# Patient Record
Sex: Female | Born: 1990 | Race: Black or African American | Hispanic: No | Marital: Single | Smoking: Never smoker
Health system: Southern US, Community
[De-identification: ages and names within clinical notes are randomized; demographics above are authoritative.]

## PROBLEM LIST (undated history)

## (undated) DIAGNOSIS — Z86018 Personal history of other benign neoplasm: Secondary | ICD-10-CM

## (undated) DIAGNOSIS — K602 Anal fissure, unspecified: Secondary | ICD-10-CM

## (undated) DIAGNOSIS — Z9289 Personal history of other medical treatment: Secondary | ICD-10-CM

## (undated) HISTORY — DX: Anal fissure, unspecified: K60.2

## (undated) HISTORY — PX: TOOTH EXTRACTION: SUR596

## (undated) HISTORY — DX: Personal history of other benign neoplasm: Z86.018

---

## 2012-04-02 ENCOUNTER — Ambulatory Visit: Payer: Self-pay | Admitting: Physician Assistant

## 2012-04-02 VITALS — BP 139/89 | HR 85 | Temp 98.7°F | Resp 16 | Ht 66.5 in | Wt 111.2 lb

## 2012-04-02 DIAGNOSIS — K0889 Other specified disorders of teeth and supporting structures: Secondary | ICD-10-CM

## 2012-04-02 DIAGNOSIS — K089 Disorder of teeth and supporting structures, unspecified: Secondary | ICD-10-CM

## 2012-04-02 DIAGNOSIS — K029 Dental caries, unspecified: Secondary | ICD-10-CM

## 2012-04-02 MED ORDER — HYDROCODONE-ACETAMINOPHEN 5-325 MG PO TABS: 1.0000 | ORAL_TABLET | Freq: Four times a day (QID) | ORAL | Status: DC | PRN

## 2012-04-02 MED ORDER — AMOXICILLIN 875 MG PO TABS: 1750.0000 mg | ORAL_TABLET | Freq: Two times a day (BID) | ORAL | Status: DC

## 2012-04-02 NOTE — Patient Instructions (Addendum)
Begin taking the antibiotic tonight.  Take one pill twice daily.  Take with food to reduce stomach upset.  Vicodin every 6 hours if needed for pain.  I would encourage you to call a dentist tomorrow as you likely will need that tooth repaired   Dental Caries  Tooth decay (dental caries, cavities) is the most common of all oral diseases. It occurs in all ages but is more common in children and young adults.  CAUSES  Bacteria in your mouth combine with foods (particularly sugars and starches) to produce plaque. Plaque is a substance that sticks to the hard surfaces of teeth. The bacteria in the plaque produce acids that attack the enamel of teeth. Repeated acid attacks dissolve the enamel and create holes in the teeth. Root surfaces of teeth may also get these holes.  Other contributing factors include:   Frequent snacking and drinking of cavity-producing foods and liquids.  Poor oral hygiene.  Dry mouth.  Substance abuse such as methamphetamine.  Broken or poor fitting dental restorations.  Eating disorders.  Gastroesophageal reflux disease (GERD).  Certain radiation treatments to the head and neck. SYMPTOMS  At first, dental decay appears as white, chalky areas on the enamel. In this early stage, symptoms are seldom present. As the decay progresses, pits and holes may appear on the enamel surfaces. Progression of the decay will lead to softening of the hard layers of the tooth. At this point you may experience some pain or achy feeling after sweet, hot, or cold foods or drinks are consumed. If left untreated, the decay will reach the internal structures of the tooth and produce severe pain. Extensive dental treatment, such as root canal therapy, may be needed to save the tooth at this late stage of decay development.  DIAGNOSIS  Most cavities will be detected during regular check-ups. A thorough medical and dental history will be taken by the dentist. The dentist will use instruments to  check the surfaces of your teeth for any breakdown or discoloration. Some dentists have special instruments, such as lasers, that detect tooth decay. Dental X-rays may also show some cavities that are not visible to the eye (such as between the contact areas of the teeth). TREATMENT  Treatment involves removal of the tooth decay and replacement with a restorative material such as silver, gold, or composite (white) material. However, if the decay involves a large area of the tooth and there is little remaining healthy tooth structure, a cap (crown) will be fitted over the remaining structure. If the decay involves the center part of the tooth (pulp), root canal treatment will be needed before any type of dental restoration is placed. If the tooth is severely destroyed by the decay process, leaving the remaining tooth structures unrestorable, the tooth will need to be pulled (extracted). Some early tooth decay may be reversed by fluoride treatments and thorough brushing and flossing at home. PREVENTION   Eat healthy foods. Restrict the amount of sugary, starchy foods and liquids you consume. Avoid frequent snacking and drinking of unhealthy foods and liquids.  Sealants can help with prevention of cavities. Sealants are composite resins applied onto the biting surfaces of teeth at risk for decay. They smooth out the pits and grooves and prevent food from being trapped in them. This is done in early childhood before tooth decay has started.  Fluoride tablets may also be prescribed to children between 6 months and 1 years of age if your drinking water is not fluoridated. The fluoride  absorbed by the tooth enamel makes teeth less susceptible to decay. Thorough daily cleaning with a toothbrush and dental floss is the best way to prevent cavities. Use of a fluoride toothpaste is highly recommended. Fluoride mouth rinses may be used in specific cases.  Topical application of fluoride by your dentist is important  in children.  Regular visits with a dentist for checkups and cleanings are also important. SEEK IMMEDIATE DENTAL CARE IF:  You have a fever.  You develop redness and swelling of your face, jaw, or neck.  You develop swelling around a tooth.  You are unable to open your mouth or cannot swallow.  You have severe pain uncontrolled by pain medicine. Document Released: 11/02/2001 Document Revised: 05/05/2011 Document Reviewed: 07/18/2010 [facility] Patient Information 2013 [place], [place].

## 2012-04-02 NOTE — Progress Notes (Signed)
  Subjective:    Patient ID: Wendy Mccarty, female    DOB: December 29, 1990, 22 y.o.   MRN: [id number]  HPI   [name] is a pleasant 22 yr old female with an "excruciating tooth ache".  Bottom right molar has been hurting off and on for about a month.  This morning the pain became worse.  States she can see a hole in the tooth, knows she needs to go to a dentist.  She is new to the area and does not have a dentist yet.  Also was waiting until she got dental insurance.  Has used Advil and aspirin today for pain which is not helping.  Requests something stronger for pain.  Tooth pain has caused her to have a headache today.  Denies any other associated symptoms.     Review of Systems  Constitutional: Negative for fever and chills.  HENT: Positive for dental problem. Negative for ear pain, nosebleeds, congestion, sore throat, rhinorrhea, sneezing, drooling, mouth sores and postnasal drip.   Respiratory: Negative.   Cardiovascular: Negative.   Gastrointestinal: Negative.   Musculoskeletal: Negative.   Skin: Negative.   Neurological: Positive for headaches.       Objective:   Physical Exam  Vitals reviewed. Constitutional: She is oriented to person, place, and time. She appears well-developed and well-nourished. No distress.  HENT:  Head: Normocephalic and atraumatic.  Mouth/Throat: Abnormal dentition. Dental caries present. No dental abscesses.         Multiple dental caries; prominent decay of right lower molar   Eyes: Conjunctivae normal are normal. No scleral icterus.  Neck: Neck supple.  Cardiovascular: Normal rate, regular rhythm and normal heart sounds.  Exam reveals no gallop and no friction rub.   No murmur heard. Pulmonary/Chest: Effort normal and breath sounds normal. She has no wheezes. She has no rales.  Lymphadenopathy:    She has no cervical adenopathy.  Neurological: She is alert and oriented to person, place, and time.  Skin: Skin is warm and dry.  Psychiatric: She has a  normal mood and affect.     Filed Vitals:   04/02/12 1831  BP: 139/89  Pulse: 85  Temp: 98.7 F (37.1 C)  Resp: 16        Assessment & Plan:   1. Tooth pain  amoxicillin (AMOXIL) 875 MG tablet, HYDROcodone-acetaminophen (NORCO/VICODIN) 5-325 MG per tablet  2. Dental caries  amoxicillin (AMOXIL) 875 MG tablet    [name] is a pleasant 22 yr old female here with tooth pain and tooth decay.  The tooth does not appear to be abscessed at this time.  Will start antibiotics to cover for infection.  Vicodin for pain relief.  Encouraged her to call a dentist tomorrow.  Provided her with the phone number of a local dentist that is open over the weekend and will take emergencies.

## 2012-04-12 ENCOUNTER — Emergency Department (HOSPITAL_COMMUNITY): Payer: Self-pay

## 2012-04-12 ENCOUNTER — Encounter (HOSPITAL_COMMUNITY): Payer: Self-pay | Admitting: Emergency Medicine

## 2012-04-12 ENCOUNTER — Emergency Department (HOSPITAL_COMMUNITY)
Admission: EM | Admit: 2012-04-12 | Discharge: 2012-04-12 | Disposition: A | Discharge status: Home / Self Care | Payer: Self-pay | Attending: Emergency Medicine | Admitting: Emergency Medicine

## 2012-04-12 DIAGNOSIS — Y9289 Other specified places as the place of occurrence of the external cause: Secondary | ICD-10-CM | POA: Insufficient documentation

## 2012-04-12 DIAGNOSIS — R296 Repeated falls: Secondary | ICD-10-CM | POA: Insufficient documentation

## 2012-04-12 DIAGNOSIS — Z79899 Other long term (current) drug therapy: Secondary | ICD-10-CM | POA: Insufficient documentation

## 2012-04-12 DIAGNOSIS — S52613A Displaced fracture of unspecified ulna styloid process, initial encounter for closed fracture: Secondary | ICD-10-CM

## 2012-04-12 DIAGNOSIS — S52609A Unspecified fracture of lower end of unspecified ulna, initial encounter for closed fracture: Secondary | ICD-10-CM | POA: Insufficient documentation

## 2012-04-12 DIAGNOSIS — Y939 Activity, unspecified: Secondary | ICD-10-CM | POA: Insufficient documentation

## 2012-04-12 DIAGNOSIS — S52509A Unspecified fracture of the lower end of unspecified radius, initial encounter for closed fracture: Secondary | ICD-10-CM

## 2012-04-12 DIAGNOSIS — S52599A Other fractures of lower end of unspecified radius, initial encounter for closed fracture: Secondary | ICD-10-CM | POA: Insufficient documentation

## 2012-04-12 MED ORDER — OXYCODONE-ACETAMINOPHEN 5-325 MG PO TABS
2.0000 | ORAL_TABLET | Freq: Once | ORAL | Status: AC
  Administered 2012-04-12: 2 via ORAL
  Filled 2012-04-12: qty 2

## 2012-04-12 MED ORDER — FENTANYL CITRATE 0.05 MG/ML IJ SOLN
50.0000 ug | Freq: Once | INTRAMUSCULAR | Status: AC
  Administered 2012-04-12: 50 ug via INTRAVENOUS
  Filled 2012-04-12: qty 2

## 2012-04-12 MED ORDER — DIPHENHYDRAMINE HCL 50 MG/ML IJ SOLN
INTRAMUSCULAR | Status: AC
  Filled 2012-04-12: qty 1

## 2012-04-12 MED ORDER — OXYCODONE-ACETAMINOPHEN 5-325 MG PO TABS: 1.0000 | ORAL_TABLET | Freq: Four times a day (QID) | ORAL | Status: DC | PRN

## 2012-04-12 MED ORDER — FENTANYL CITRATE 0.05 MG/ML IJ SOLN
50.0000 ug | Freq: Once | INTRAMUSCULAR | Status: AC
  Administered 2012-04-12: 50 ug via INTRAVENOUS

## 2012-04-12 MED ORDER — KETOROLAC TROMETHAMINE 30 MG/ML IJ SOLN
30.0000 mg | Freq: Once | INTRAMUSCULAR | Status: AC
  Administered 2012-04-12: 30 mg via INTRAVENOUS
  Filled 2012-04-12: qty 1

## 2012-04-12 MED ORDER — ONDANSETRON HCL 4 MG/2ML IJ SOLN
4.0000 mg | Freq: Once | INTRAMUSCULAR | Status: AC
  Administered 2012-04-12: 4 mg via INTRAVENOUS
  Filled 2012-04-12: qty 2

## 2012-04-12 NOTE — ED Provider Notes (Signed)
History     CSN: [id number]  Arrival date & time 04/12/12  0400   First MD Initiated Contact with Patient 04/12/12 0402      Chief Complaint  Patient presents with  . Wrist Pain   HPI  History provided by the patient. Patient is a 22 year old female with no significant PMH who presents with left wrist pain and injury after fall. Patient states she was returning home from clubbing morning of steps when she lost balance and fell backwards. She fell onto her outstretched left and right hands but reports having pain, swelling and deformity to her left wrist. Pain is described as severe. Patient has not used any treatments for her symptoms and came to emergency room. She denies any numbness or weakness in the hand. She reports increasing pain with any movements of the wrist or hand. Denies any other injury. Denies head injury or LOC. She denies any alcohol use.    History reviewed. No pertinent past medical history.  History reviewed. No pertinent past surgical history.  Family History  Problem Relation Age of Onset  . Hypertension Mother   . Hypertension Maternal Grandfather     History  Substance Use Topics  . Smoking status: Never Smoker   . Smokeless tobacco: Not on file  . Alcohol Use: No    OB History   Grav Para Term Preterm Abortions TAB SAB Ect Mult Living                  Review of Systems  All other systems reviewed and are negative.    Allergies  Review of patient's allergies indicates no known allergies.  Home Medications   Current Outpatient Rx  Name  Route  Sig  Dispense  Refill  . amoxicillin (AMOXIL) 875 MG tablet   Oral   Take 2 tablets (1,750 mg total) by mouth 2 (two) times daily.   20 tablet   0   . aspirin 325 MG tablet   Oral   Take 325 mg by mouth every 6 (six) hours as needed for pain.         Marland Kitchen HYDROcodone-acetaminophen (NORCO/VICODIN) 5-325 MG per tablet   Oral   Take 1 tablet by mouth every 6 (six) hours as needed for pain.  20 tablet   0   . naproxen sodium (ANAPROX) 220 MG tablet   Oral   Take 220 mg by mouth 2 (two) times daily as needed (for pain).          . Norethindrone Acetate-Ethinyl Estradiol (LOESTRIN 1.5/30, 21,) 1.5-30 MG-MCG tablet   Oral   Take 1 tablet by mouth daily.           BP 144/84  Pulse 103  Temp(Src) 98.2 F (36.8 C) (Oral)  Resp 20  SpO2 98%  LMP 03/28/2012  Physical Exam  Nursing note and vitals reviewed. Constitutional: She is oriented to person, place, and time. She appears well-developed and well-nourished. No distress.  HENT:  Head: Normocephalic and atraumatic.  Eyes: Conjunctivae are normal.  Neck: Normal range of motion. Neck supple.  Cardiovascular: Normal rate and regular rhythm.   Pulmonary/Chest: Effort normal and breath sounds normal.  Musculoskeletal: She exhibits edema and tenderness.  Reduced range of motion of left wrist and hand secondary to pain and swelling. There is swelling and deformity to the left wrist. Normal radial pulses. Normal sensation and cap refill to the tips of all fingers.  Neurological: She is alert and oriented to person, place,  and time.  Skin: Skin is warm and dry. No rash noted.  Psychiatric: She has a normal mood and affect. Her behavior is normal.    ED Course  Procedures   Dg Wrist Complete Left  04/12/2012  *RADIOLOGY REPORT*  Clinical Data: Left wrist pain following trauma  LEFT WRIST - COMPLETE 3+ VIEW  Comparison: None.  Findings: Transverse fracture distal radius with mild comminution. Dorsal displacement and angulation.  There may be a nondominant vertically oriented component extending to the radiocarpal joint however no joint space step off.  Ulnar styloid fracture.  Diffuse soft tissue swelling.  IMPRESSION: Distal radius and ulnar styloid fractures as above.   Original Report Authenticated By: Jearld Lesch, M.D.      1. Distal radial fracture   2. Fracture of ulnar styloid       MDM  Patient seen  and evaluated. Patient appears uncomfortable holding left wrist across flap on the ice pack. Patient given initial triage pain protocol medications with fentanyl. Additional pain medications ordered.  Patient and X-rays reviewed and discussed with attending physician. Will place patient in sugar tong splint and sling and provide orthopedic hand referral.      Angus Seller, PA 04/12/12 [phone]

## 2012-04-12 NOTE — Progress Notes (Signed)
Orthopedic Tech Progress Note Patient Details:  Wendy Mccarty 1990-12-09 [id number]  Ortho Devices Type of Ortho Device: Arm sling;Sugartong splint Ortho Device/Splint Location: left  arm   Haskell Flirt 04/12/2012, 5:20 AM

## 2012-04-12 NOTE — ED Notes (Signed)
Pt to xray

## 2012-04-12 NOTE — ED Notes (Signed)
EDPA finished at [facility], ortho tech paged, pending arrival, PO meds given with crackers, pain worse after xray, no relief from fentanyl. Denies nausea.

## 2012-04-12 NOTE — ED Notes (Signed)
Pt slipped and fell on steps just pta.  C/o pain, swelling, and deformity to L wrist.  CMS intact.  Pt straight to treatment room on arrival and ice pack given.

## 2012-04-12 NOTE — ED Notes (Signed)
"  feels better", rates 2/10, cap refill <2sec, fingers warm.

## 2012-04-13 NOTE — ED Provider Notes (Signed)
Medical screening examination/treatment/procedure(s) were performed by non-physician practitioner and as supervising physician I was immediately available for consultation/collaboration.  Geoffery Lyons, MD 04/13/12 [phone]

## 2012-04-14 ENCOUNTER — Encounter (HOSPITAL_COMMUNITY): Payer: Self-pay | Admitting: Pharmacy Technician

## 2012-04-14 ENCOUNTER — Encounter (HOSPITAL_COMMUNITY): Payer: Self-pay | Admitting: *Deleted

## 2012-04-15 ENCOUNTER — Ambulatory Visit (HOSPITAL_COMMUNITY): Payer: Self-pay

## 2012-04-15 ENCOUNTER — Encounter (HOSPITAL_COMMUNITY): Payer: Self-pay | Admitting: Anesthesiology

## 2012-04-15 ENCOUNTER — Observation Stay (HOSPITAL_COMMUNITY)
Admission: RE | Admit: 2012-04-15 | Discharge: 2012-04-16 | Disposition: A | Discharge status: Home / Self Care | Payer: Self-pay | Source: Ambulatory Visit | Attending: Orthopedic Surgery | Admitting: Orthopedic Surgery

## 2012-04-15 ENCOUNTER — Ambulatory Visit (HOSPITAL_COMMUNITY): Payer: Self-pay | Admitting: Anesthesiology

## 2012-04-15 ENCOUNTER — Other Ambulatory Visit: Payer: Self-pay | Admitting: Orthopedic Surgery

## 2012-04-15 ENCOUNTER — Encounter (HOSPITAL_COMMUNITY): Payer: Self-pay | Admitting: *Deleted

## 2012-04-15 ENCOUNTER — Encounter (HOSPITAL_COMMUNITY): Payer: Self-pay | Admitting: Pharmacy Technician

## 2012-04-15 ENCOUNTER — Encounter (HOSPITAL_COMMUNITY)
Admission: RE | Disposition: A | Discharge status: Home / Self Care | Payer: Self-pay | Source: Ambulatory Visit | Attending: Orthopedic Surgery

## 2012-04-15 DIAGNOSIS — G5600 Carpal tunnel syndrome, unspecified upper limb: Secondary | ICD-10-CM | POA: Insufficient documentation

## 2012-04-15 DIAGNOSIS — S52509A Unspecified fracture of the lower end of unspecified radius, initial encounter for closed fracture: Principal | ICD-10-CM | POA: Insufficient documentation

## 2012-04-15 DIAGNOSIS — W108XXA Fall (on) (from) other stairs and steps, initial encounter: Secondary | ICD-10-CM | POA: Insufficient documentation

## 2012-04-15 DIAGNOSIS — S52532A Colles' fracture of left radius, initial encounter for closed fracture: Secondary | ICD-10-CM

## 2012-04-15 HISTORY — DX: Personal history of other medical treatment: Z92.89

## 2012-04-15 HISTORY — PX: CARPAL TUNNEL RELEASE: SHX101

## 2012-04-15 HISTORY — PX: OPEN REDUCTION INTERNAL FIXATION (ORIF) DISTAL RADIAL FRACTURE: SHX5989

## 2012-04-15 LAB — SURGICAL PCR SCREEN
MRSA, PCR: NEGATIVE
Staphylococcus aureus: POSITIVE — AB

## 2012-04-15 LAB — CBC
MCH: 32.3 pg (ref 26.0–34.0)
Platelets: 293 10*3/uL (ref 150–400)
RBC: 4.64 MIL/uL (ref 3.87–5.11)
WBC: 10.2 10*3/uL (ref 4.0–10.5)

## 2012-04-15 SURGERY — OPEN REDUCTION INTERNAL FIXATION (ORIF) DISTAL RADIUS FRACTURE
Anesthesia: General | Site: Wrist | Laterality: Left | Wound class: Clean

## 2012-04-15 MED ORDER — LACTATED RINGERS IV SOLN: INTRAVENOUS | Status: DC

## 2012-04-15 MED ORDER — CEFAZOLIN SODIUM 1-5 GM-% IV SOLN
1.0000 g | Freq: Three times a day (TID) | INTRAVENOUS | Status: DC
  Administered 2012-04-16 (×2): 1 g via INTRAVENOUS
  Filled 2012-04-15 (×4): qty 50

## 2012-04-15 MED ORDER — MEPERIDINE HCL 25 MG/ML IJ SOLN: 6.2500 mg | INTRAMUSCULAR | Status: DC | PRN

## 2012-04-15 MED ORDER — BUPIVACAINE-EPINEPHRINE PF 0.5-1:200000 % IJ SOLN
INTRAMUSCULAR | Status: DC | PRN
  Administered 2012-04-15: 30 mL

## 2012-04-15 MED ORDER — OXYCODONE HCL 5 MG PO TABS
5.0000 mg | ORAL_TABLET | ORAL | Status: DC | PRN
  Administered 2012-04-16: 5 mg via ORAL
  Administered 2012-04-16: 10 mg via ORAL
  Administered 2012-04-16: 5 mg via ORAL
  Filled 2012-04-15 (×2): qty 1
  Filled 2012-04-15: qty 2

## 2012-04-15 MED ORDER — CHLORHEXIDINE GLUCONATE 4 % EX LIQD
60.0000 mL | Freq: Once | CUTANEOUS | Status: AC
  Administered 2012-04-15: 4 via TOPICAL

## 2012-04-15 MED ORDER — PROMETHAZINE HCL 25 MG RE SUPP: 12.5000 mg | Freq: Four times a day (QID) | RECTAL | Status: DC | PRN

## 2012-04-15 MED ORDER — DEXAMETHASONE SODIUM PHOSPHATE 4 MG/ML IJ SOLN
INTRAMUSCULAR | Status: DC | PRN
  Administered 2012-04-15: 4 mg via INTRAVENOUS

## 2012-04-15 MED ORDER — CEFAZOLIN SODIUM-DEXTROSE 2-3 GM-% IV SOLR
2.0000 g | INTRAVENOUS | Status: AC
  Administered 2012-04-15: 2 g via INTRAVENOUS
  Filled 2012-04-15: qty 50

## 2012-04-15 MED ORDER — PROPOFOL 10 MG/ML IV BOLUS
INTRAVENOUS | Status: DC | PRN
  Administered 2012-04-15: 50 mg via INTRAVENOUS
  Administered 2012-04-15: 150 mg via INTRAVENOUS

## 2012-04-15 MED ORDER — ONDANSETRON HCL 4 MG/2ML IJ SOLN
INTRAMUSCULAR | Status: DC | PRN
  Administered 2012-04-15: 4 mg via INTRAVENOUS

## 2012-04-15 MED ORDER — ONDANSETRON HCL 4 MG/2ML IJ SOLN: 4.0000 mg | Freq: Four times a day (QID) | INTRAMUSCULAR | Status: DC | PRN

## 2012-04-15 MED ORDER — FAMOTIDINE 20 MG PO TABS
20.0000 mg | ORAL_TABLET | Freq: Two times a day (BID) | ORAL | Status: DC | PRN
  Filled 2012-04-15: qty 1

## 2012-04-15 MED ORDER — MUPIROCIN 2 % EX OINT
TOPICAL_OINTMENT | CUTANEOUS | Status: AC
  Administered 2012-04-15: 1 via NASAL
  Filled 2012-04-15: qty 22

## 2012-04-15 MED ORDER — OXYCODONE HCL 5 MG/5ML PO SOLN: 5.0000 mg | Freq: Once | ORAL | Status: DC | PRN

## 2012-04-15 MED ORDER — ALPRAZOLAM 0.5 MG PO TABS
0.5000 mg | ORAL_TABLET | Freq: Four times a day (QID) | ORAL | Status: DC | PRN
  Administered 2012-04-16: 0.5 mg via ORAL
  Filled 2012-04-15: qty 1

## 2012-04-15 MED ORDER — ONDANSETRON HCL 4 MG/2ML IJ SOLN: 4.0000 mg | Freq: Once | INTRAMUSCULAR | Status: DC | PRN

## 2012-04-15 MED ORDER — FENTANYL CITRATE 0.05 MG/ML IJ SOLN
INTRAMUSCULAR | Status: DC | PRN
  Administered 2012-04-15: 100 ug via INTRAVENOUS

## 2012-04-15 MED ORDER — MIDAZOLAM HCL 5 MG/5ML IJ SOLN: INTRAMUSCULAR | Status: DC | PRN

## 2012-04-15 MED ORDER — MIDAZOLAM HCL 5 MG/5ML IJ SOLN
INTRAMUSCULAR | Status: DC | PRN
  Administered 2012-04-15: 2 mg via INTRAVENOUS

## 2012-04-15 MED ORDER — LIDOCAINE HCL (CARDIAC) 20 MG/ML IV SOLN
INTRAVENOUS | Status: DC | PRN
  Administered 2012-04-15: 80 mg via INTRAVENOUS

## 2012-04-15 MED ORDER — METHOCARBAMOL 100 MG/ML IJ SOLN: 500.0000 mg | Freq: Four times a day (QID) | INTRAVENOUS | Status: DC | PRN

## 2012-04-15 MED ORDER — FENTANYL CITRATE 0.05 MG/ML IJ SOLN: INTRAMUSCULAR | Status: DC | PRN

## 2012-04-15 MED ORDER — HYDROMORPHONE HCL PF 1 MG/ML IJ SOLN: 0.2500 mg | INTRAMUSCULAR | Status: DC | PRN

## 2012-04-15 MED ORDER — CEFAZOLIN SODIUM 1-5 GM-% IV SOLN
1.0000 g | INTRAVENOUS | Status: AC
  Administered 2012-04-16: 1 g via INTRAVENOUS
  Filled 2012-04-15: qty 50

## 2012-04-15 MED ORDER — OXYCODONE HCL 5 MG PO TABS: 5.0000 mg | ORAL_TABLET | Freq: Once | ORAL | Status: DC | PRN

## 2012-04-15 MED ORDER — MORPHINE SULFATE 2 MG/ML IJ SOLN
1.0000 mg | INTRAMUSCULAR | Status: DC | PRN
  Administered 2012-04-16 (×2): 1 mg via INTRAVENOUS
  Filled 2012-04-15 (×2): qty 1

## 2012-04-15 MED ORDER — ONDANSETRON HCL 4 MG PO TABS: 4.0000 mg | ORAL_TABLET | Freq: Four times a day (QID) | ORAL | Status: DC | PRN

## 2012-04-15 MED ORDER — 0.9 % SODIUM CHLORIDE (POUR BTL) OPTIME
TOPICAL | Status: DC | PRN
  Administered 2012-04-15 (×2): 1000 mL

## 2012-04-15 MED ORDER — LACTATED RINGERS IV SOLN
INTRAVENOUS | Status: DC | PRN
  Administered 2012-04-15: 19:00:00 via INTRAVENOUS

## 2012-04-15 MED ORDER — METHOCARBAMOL 500 MG PO TABS
500.0000 mg | ORAL_TABLET | Freq: Four times a day (QID) | ORAL | Status: DC | PRN
  Administered 2012-04-16: 500 mg via ORAL
  Filled 2012-04-15 (×2): qty 1

## 2012-04-15 MED ORDER — SODIUM CHLORIDE 0.45 % IV SOLN: INTRAVENOUS | Status: DC

## 2012-04-15 MED ORDER — DOCUSATE SODIUM 100 MG PO CAPS
100.0000 mg | ORAL_CAPSULE | Freq: Two times a day (BID) | ORAL | Status: DC
  Administered 2012-04-16: 100 mg via ORAL
  Filled 2012-04-15 (×3): qty 1

## 2012-04-15 MED ORDER — BUPIVACAINE HCL (PF) 0.25 % IJ SOLN
INTRAMUSCULAR | Status: AC
  Filled 2012-04-15: qty 30

## 2012-04-15 MED ORDER — VITAMIN C 500 MG PO TABS
1000.0000 mg | ORAL_TABLET | Freq: Every day | ORAL | Status: DC
  Administered 2012-04-16: 1000 mg via ORAL
  Filled 2012-04-15: qty 2

## 2012-04-15 SURGICAL SUPPLY — 66 items
BANDAGE ELASTIC 3 VELCRO ST LF (GAUZE/BANDAGES/DRESSINGS) ×6 IMPLANT
BANDAGE ELASTIC 4 VELCRO ST LF (GAUZE/BANDAGES/DRESSINGS) ×3 IMPLANT
BANDAGE GAUZE ELAST BULKY 4 IN (GAUZE/BANDAGES/DRESSINGS) ×3 IMPLANT
BIT DRILL 2 FAST STEP (BIT) ×3 IMPLANT
BLADE SURG ROTATE 9660 (MISCELLANEOUS) IMPLANT
BNDG ESMARK 4X9 LF (GAUZE/BANDAGES/DRESSINGS) ×3 IMPLANT
CLOTH BEACON ORANGE TIMEOUT ST (SAFETY) ×3 IMPLANT
CORDS BIPOLAR (ELECTRODE) ×3 IMPLANT
COVER SURGICAL LIGHT HANDLE (MISCELLANEOUS) ×3 IMPLANT
CUFF TOURNIQUET SINGLE 18IN (TOURNIQUET CUFF) ×3 IMPLANT
CUFF TOURNIQUET SINGLE 24IN (TOURNIQUET CUFF) IMPLANT
DECANTER SPIKE VIAL GLASS SM (MISCELLANEOUS) IMPLANT
DRAIN TLS ROUND 10FR (DRAIN) IMPLANT
DRAPE OEC MINIVIEW 54X84 (DRAPES) IMPLANT
DRAPE SURG 17X23 STRL (DRAPES) ×3 IMPLANT
DRAPE U-SHAPE 47X51 STRL (DRAPES) ×3 IMPLANT
DRSG EMULSION OIL 3X3 NADH (GAUZE/BANDAGES/DRESSINGS) ×3 IMPLANT
EVACUATOR 1/8 PVC DRAIN (DRAIN) IMPLANT
GAUZE XEROFORM 1X8 LF (GAUZE/BANDAGES/DRESSINGS) ×3 IMPLANT
GLOVE BIOGEL M STRL SZ7.5 (GLOVE) ×3 IMPLANT
GLOVE ORTHO TXT STRL SZ7.5 (GLOVE) ×3 IMPLANT
GLOVE SS BIOGEL STRL SZ 8 (GLOVE) ×2 IMPLANT
GLOVE SUPERSENSE BIOGEL SZ 8 (GLOVE) ×1
GOWN PREVENTION PLUS XLARGE (GOWN DISPOSABLE) ×3 IMPLANT
GOWN STRL NON-REIN LRG LVL3 (GOWN DISPOSABLE) ×9 IMPLANT
GOWN STRL REIN XL XLG (GOWN DISPOSABLE) ×6 IMPLANT
KIT BASIN OR (CUSTOM PROCEDURE TRAY) ×3 IMPLANT
KIT ROOM TURNOVER OR (KITS) ×3 IMPLANT
LOOP VESSEL MAXI BLUE (MISCELLANEOUS) IMPLANT
MANIFOLD NEPTUNE II (INSTRUMENTS) ×3 IMPLANT
NEEDLE 22X1 1/2 (OR ONLY) (NEEDLE) IMPLANT
NEEDLE HYPO 25GX1X1/2 BEV (NEEDLE) IMPLANT
NS IRRIG 1000ML POUR BTL (IV SOLUTION) ×3 IMPLANT
PACK ORTHO EXTREMITY (CUSTOM PROCEDURE TRAY) ×3 IMPLANT
PAD ARMBOARD 7.5X6 YLW CONV (MISCELLANEOUS) ×6 IMPLANT
PAD CAST 4YDX4 CTTN HI CHSV (CAST SUPPLIES) ×4 IMPLANT
PADDING CAST COTTON 4X4 STRL (CAST SUPPLIES) ×2
PEG SUBCHONDRAL SMOOTH 2.0X16 (Peg) ×3 IMPLANT
PEG SUBCHONDRAL SMOOTH 2.0X18 (Peg) ×6 IMPLANT
PEG SUBCHONDRAL SMOOTH 2.0X20 (Peg) ×6 IMPLANT
PEG SUBCHONDRAL SMOOTH 2.0X22 (Peg) ×3 IMPLANT
PLATE SHORT 57 NRRW LT (Plate) ×3 IMPLANT
SCREW BN 12X3.5XNS CORT TI (Screw) ×4 IMPLANT
SCREW BN 13X3.5XNS CORT TI (Screw) ×2 IMPLANT
SCREW CORT 3.5X10 LNG (Screw) ×3 IMPLANT
SCREW CORT 3.5X12 (Screw) ×2 IMPLANT
SCREW CORT 3.5X13 (Screw) ×1 IMPLANT
SOLUTION BETADINE 4OZ (MISCELLANEOUS) ×3 IMPLANT
SPONGE GAUZE 4X4 12PLY (GAUZE/BANDAGES/DRESSINGS) ×3 IMPLANT
SPONGE LAP 4X18 X RAY DECT (DISPOSABLE) IMPLANT
SPONGE SCRUB IODOPHOR (GAUZE/BANDAGES/DRESSINGS) ×3 IMPLANT
SUCTION FRAZIER TIP 10 FR DISP (SUCTIONS) IMPLANT
SUT MNCRL AB 4-0 PS2 18 (SUTURE) ×3 IMPLANT
SUT PROLENE 3 0 PS 2 (SUTURE) IMPLANT
SUT PROLENE 4 0 PS 2 18 (SUTURE) ×3 IMPLANT
SUT VIC AB 2-0 CT1 27 (SUTURE)
SUT VIC AB 2-0 CT1 TAPERPNT 27 (SUTURE) IMPLANT
SUT VIC AB 3-0 FS2 27 (SUTURE) IMPLANT
SYR CONTROL 10ML LL (SYRINGE) IMPLANT
SYSTEM CHEST DRAIN TLS 7FR (DRAIN) IMPLANT
TOWEL OR 17X24 6PK STRL BLUE (TOWEL DISPOSABLE) ×3 IMPLANT
TOWEL OR 17X26 10 PK STRL BLUE (TOWEL DISPOSABLE) ×3 IMPLANT
TUBE CONNECTING 12X1/4 (SUCTIONS) ×3 IMPLANT
TUBE EVACUATION TLS (MISCELLANEOUS) ×3 IMPLANT
UNDERPAD 30X30 INCONTINENT (UNDERPADS AND DIAPERS) ×3 IMPLANT
WATER STERILE IRR 1000ML POUR (IV SOLUTION) ×3 IMPLANT

## 2012-04-15 NOTE — Anesthesia Postprocedure Evaluation (Signed)
Anesthesia Post Note  Patient: Wendy Mccarty  Procedure(s) Performed: Procedure(s) (LRB): OPEN REDUCTION INTERNAL FIXATION (ORIF) LEFT DISTAL RADIUS FRACTURE (Left) OPEN CARPAL TUNNEL RELEASE WITH REPAIR RECONSTRUCTION AS NECESSARY  (Left)  Anesthesia type: general  Patient location: PACU  Post pain: Pain level controlled  Post assessment: Patient's Cardiovascular Status Stable  Last Vitals:  Filed Vitals:   04/15/12 2045  BP: 132/70  Pulse:   Temp: 36.9 C  Resp: 18    Post vital signs: Reviewed and stable  Level of consciousness: sedated  Complications: No apparent anesthesia complications

## 2012-04-15 NOTE — Anesthesia Procedure Notes (Signed)
Anesthesia Regional Block:  Supraclavicular block  Pre-Anesthetic Checklist: ,, timeout performed, Correct Patient, Correct Site, Correct Laterality, Correct Procedure, Correct Position, site marked, Risks and benefits discussed,  Surgical consent,  Pre-op evaluation,  At surgeon's request and post-op pain management  Laterality: Left  Prep: chloraprep       Needles:   Needle Type: Echogenic Stimulator Needle     Needle Length: 5cm 5 cm Needle Gauge: 21 G    Additional Needles:  Procedures: ultrasound guided (picture in chart) and nerve stimulator Supraclavicular block  Nerve Stimulator or Paresthesia:  Response: 0.4 mA,   Additional Responses:   Narrative:  Start time: 04/15/2012 6:30 PM End time: 04/15/2012 6:45 PM Injection made incrementally with aspirations every 5 mL.  Performed by: Personally  Anesthesiologist: Arta Bruce MD  Additional Notes: Monitors applied. Patient sedated. Sterile prep and drape,hand hygiene and sterile gloves were used. Relevant anatomy identified.Needle position confirmed.Local anesthetic injected incrementally after negative aspiration. Local anesthetic spread visualized around nerve(s). Vascular puncture avoided. No complications. Image printed for medical record.The patient tolerated the procedure well.       Supraclavicular block

## 2012-04-15 NOTE — Transfer of Care (Signed)
Immediate Anesthesia Transfer of Care Note  Patient: Wendy Mccarty  Procedure(s) Performed: Procedure(s): OPEN REDUCTION INTERNAL FIXATION (ORIF) LEFT DISTAL RADIUS FRACTURE (Left) OPEN CARPAL TUNNEL RELEASE WITH REPAIR RECONSTRUCTION AS NECESSARY  (Left)  Patient Location: PACU  Anesthesia Type:General, Regional and GA combined with regional for post-op pain  Level of Consciousness: oriented, sedated, patient cooperative and responds to stimulation  Airway & Oxygen Therapy: Patient Spontanous Breathing and Patient connected to nasal cannula oxygen  Post-op Assessment: Report given to PACU RN, Post -op Vital signs reviewed and stable and Patient moving all extremities X 4  Post vital signs: Reviewed and stable  Complications: No apparent anesthesia complications

## 2012-04-15 NOTE — Op Note (Signed)
See dictation #[id number] Dominica Severin MD

## 2012-04-15 NOTE — Anesthesia Preprocedure Evaluation (Addendum)
Anesthesia Evaluation  Patient identified by MRN, date of birth, ID band Patient awake    Reviewed: Allergy & Precautions, NPO status , Patient's Chart, lab work & pertinent test results  Airway Mallampati: I  TM Distance: >3 FB Neck ROM: Full    Dental   Pulmonary          Cardiovascular     Neuro/Psych    GI/Hepatic   Endo/Other    Renal/GU      Musculoskeletal   Abdominal   Peds  Hematology   Anesthesia Other Findings   Reproductive/Obstetrics                             Anesthesia Physical Anesthesia Plan  ASA: II  Anesthesia Plan: General   Post-op Pain Management:    Induction: Intravenous  Airway Management Planned: LMA  Additional Equipment:   Intra-op Plan:   Post-operative Plan: Extubation in OR  Informed Consent: I have reviewed the patients History and Physical, chart, labs and discussed the procedure including the risks, benefits and alternatives for the proposed anesthesia with the patient or authorized representative who has indicated his/her understanding and acceptance.     Plan Discussed with: CRNA and Surgeon  Anesthesia Plan Comments:         Anesthesia Quick Evaluation  

## 2012-04-15 NOTE — H&P (Signed)
Barbera Perritt is an 22 y.o. female.   Chief Complaint:Left wrist fx HPI: Marland KitchenMarland KitchenPatient presents for evaluation and treatment of the of their upper extremity predicament. The patient denies neck back chest or of abdominal pain. The patient notes that they have no lower extremity problems. The patient from primarily complains of the upper extremity pain noted.  Past Medical History  Diagnosis Date  . History of blood transfusion     Past Surgical History  Procedure Laterality Date  . Tooth extraction      Family History  Problem Relation Age of Onset  . Hypertension Mother   . Hypertension Maternal Grandfather    Social History:  reports that she has never smoked. She does not have any smokeless tobacco history on file. She reports that she does not drink alcohol or use illicit drugs.  Allergies: No Known Allergies  Medications Prior to Admission  Medication Sig Dispense Refill  . HYDROcodone-acetaminophen (NORCO/VICODIN) 5-325 MG per tablet Take 2 tablets by mouth every 6 (six) hours as needed for pain.      Marland Kitchen levonorgestrel-ethinyl estradiol (SRONYX) 0.1-20 MG-MCG tablet Take 1 tablet by mouth daily.      Marland Kitchen oxyCODONE (OXY IR/ROXICODONE) 5 MG immediate release tablet Take 5-10 mg by mouth every 4 (four) hours as needed for pain.      Marland Kitchen oxyCODONE-acetaminophen (PERCOCET/ROXICET) 5-325 MG per tablet Take 1-2 tablets by mouth every 6 (six) hours as needed for pain.  20 tablet  0    Results for orders placed during the hospital encounter of 04/15/12 (from the past 48 hour(s))  CBC     Status: Abnormal   Collection Time    04/15/12  3:55 PM      Result Value Range   WBC 10.2  4.0 - 10.5 K/uL   RBC 4.64  3.87 - 5.11 MIL/uL   Hemoglobin 15.0  12.0 - 15.0 g/dL   HCT 78.2  95.6 - 21.3 %   MCV 88.1  78.0 - 100.0 fL   MCH 32.3  26.0 - 34.0 pg   MCHC 36.7 (*) 30.0 - 36.0 g/dL   RDW 08.6  57.8 - 46.9 %   Platelets 293  150 - 400 K/uL   No results found.  Review of Systems   Constitutional: Negative.   HENT: Negative.   Eyes: Negative.   Respiratory: Negative.   Cardiovascular: Negative.   Gastrointestinal: Negative.   Genitourinary: Negative.   Skin: Negative.   Neurological: Negative.   Endo/Heme/Allergies: Negative.     Blood pressure 140/86, pulse 87, temperature 97.9 F (36.6 C), temperature source Oral, resp. rate 16, height 5\' 6"  (1.676 m), weight 52.164 kg (115 lb), last menstrual period 04/01/2012, SpO2 99.00%. Physical Exam ..The patient is alert and oriented in no acute distress the patient complains of pain in the affected upper extremity. The patient is noted to have a normal HEENT exam. Lung fields show equal chest expansion and no shortness of breath abdomen exam is nontender without distention. Lower extremity examination does not show any fracture dislocation or blood clot symptoms. Pelvis is stable neck and back are stable and nontender  Assessment/Plan .Marland KitchenWe are planning surgery for your upper extremity. The risk and benefits of surgery include risk of bleeding infection anesthesia damage to normal structures and failure of the surgery to accomplish its intended goals of relieving symptoms and restoring function with this in mind we'll going to proceed. I have specifically discussed with the patient the pre-and postoperative regime and the  does and don'ts and risk and benefits in great detail. Risk and benefits of surgery also include risk of dystrophy chronic nerve pain failure of the healing process to go onto completion and other inherent risks of surgery The relavent the pathophysiology of the disease/injury process, as well as the alternatives for treatment and postoperative course of action has been discussed in great detail with the patient who desires to proceed.  We will do everything in our power to help you (the patient) restore function to the upper extremity. Is a pleasure to see this patient today.   Karen Chafe 04/15/2012, 4:23 PM

## 2012-04-16 ENCOUNTER — Encounter (HOSPITAL_COMMUNITY): Payer: Self-pay | Admitting: Orthopedic Surgery

## 2012-04-16 MED ORDER — METHOCARBAMOL 500 MG PO TABS: 500.0000 mg | ORAL_TABLET | Freq: Four times a day (QID) | ORAL | Status: DC | PRN

## 2012-04-16 MED ORDER — ALPRAZOLAM 0.5 MG PO TABS: 0.5000 mg | ORAL_TABLET | Freq: Four times a day (QID) | ORAL | Status: DC | PRN

## 2012-04-16 MED ORDER — HYDROMORPHONE HCL 2 MG PO TABS
2.0000 mg | ORAL_TABLET | ORAL | Status: DC | PRN
  Administered 2012-04-16: 4 mg via ORAL
  Administered 2012-04-16: 2 mg via ORAL
  Filled 2012-04-16: qty 1
  Filled 2012-04-16: qty 2

## 2012-04-16 NOTE — Discharge Instructions (Signed)
We recommend that you to take vitamin C 1000 mg a day to promote healing we also recommend that if you require her pain medicine that he take a stool softener to prevent constipation as most pain medicines will have constipation side effects. We recommend either Peri-Colace or Senokot and recommend that you also consider adding MiraLAX to prevent the constipation affects from pain medicine if you are required to use them. These medicines are over the counter and maybe purchased at a local pharmacy.  Keep bandage clean and dry.  Call for any problems.  No smoking.  Criteria for driving a car: you should be off your pain medicine for 7-8 hours, able to drive one handed(confident), thinking clearly and feeling able in your judgement to drive. Continue elevation as it will decrease swelling.  If instructed by MD move your fingers within the confines of the bandage/splint.  Use ice if instructed by your MD. Call immediately for any sudden loss of feeling in your hand/arm or change in functional abilities of the extremity.  Please continue to elevate and move your fingers 10 times an hour while awake as instructed by Dr. Amanda Pea

## 2012-04-16 NOTE — Progress Notes (Signed)
Pt provided with discharge instructions and follow up information. She is aware of signs/symptoms of infections and how to effectively take her pain medications. She is going home with her significant other.

## 2012-04-16 NOTE — Discharge Summary (Signed)
Physician Discharge Summary  Patient ID: Wendy Mccarty MRN: [id number] DOB/AGE: [birth year] 21 y.o.  Admit date: 04/15/2012 Discharge date: 04/16/2012  Admission Diagnoses: Left wrist fracture with acute carpal tunnel syndrome status post ORIF  Discharge Diagnoses: Same Active Problems:   * No active hospital problems. *   Discharged Condition: good  Hospital Course: Patient was admitted postop after surgery. She did very well over a 23 hour observation we stay. At the time of discharge she was awake alert and oriented neurovascularly intact and without problems. She was voiding walking and tolerating her diet.  Consults: None    Treatments: surgery: See op note  Discharge Exam: Blood pressure 127/67, pulse 90, temperature 98.4 F (36.9 C), temperature source Oral, resp. rate 18, height 5\' 6"  (1.676 m), weight 52.164 kg (115 lb), last menstrual period 04/01/2012, SpO2 100.00%. General appearance: alert and cooperative .Marland KitchenThe patient is alert and oriented in no acute distress the patient complains of pain in the affected upper extremity. The patient is noted to have a normal HEENT exam. Lung fields show equal chest expansion and no shortness of breath abdomen exam is nontender without distention. Lower extremity examination does not show any fracture dislocation or blood clot symptoms. Pelvis is stable neck and back are stable and nontender  Disposition: 01-Home or Self Care     Medication List    ASK your doctor about these medications       HYDROcodone-acetaminophen 5-325 MG per tablet  Commonly known as:  NORCO/VICODIN  Take 2 tablets by mouth every 6 (six) hours as needed for pain.     oxyCODONE 5 MG immediate release tablet  Commonly known as:  Oxy IR/ROXICODONE  Take 5-10 mg by mouth every 4 (four) hours as needed for pain.     oxyCODONE-acetaminophen 5-325 MG per tablet  Commonly known as:  PERCOCET/ROXICET  Take 1-2 tablets by mouth every 6 (six) hours as needed  for pain.     SRONYX 0.1-20 MG-MCG tablet  Generic drug:  levonorgestrel-ethinyl estradiol  Take 1 tablet by mouth daily.           Follow-up Information   Follow up with Karen Chafe, MD. (Please call [phone] to see Dr. Amanda Pea in 12 days)    Contact information:   [address] [place] [address] [place] [address] [phone]       Signed: Karen Chafe 04/16/2012, 11:43 AM

## 2012-04-16 NOTE — Evaluation (Signed)
Occupational Therapy Evaluation Patient Details Name: Wendy Mccarty MRN: [id number] DOB: 1990-03-26 Today's Date: 04/16/2012 Time: 4098-1191 OT Time Calculation (min): 33 min  OT Assessment / Plan / Recommendation Clinical Impression  Pleasant 22 yr old female admitted for s/p left wrist fracture.  She underwent ORIF of the ulna via Dr Butler Denmark.  Pt presents with decreased LUE function overall for selfcare tasks.  Will have assistance from her significant other at home. She has been educated on positioning, AAROM/AROM exercises for the LUE, edema control and adaptive ADL techniques.      OT Assessment  All further OT needs can be met in the next venue of care (Progress per MD recommendation via outpatient setting.)    Follow Up Recommendations  Outpatient OT (as MD allows when ready )             Frequency       Precautions / Restrictions Restrictions Weight Bearing Restrictions: Yes LUE Weight Bearing: Non weight bearing   Pertinent Vitals/Pain Vitals stable    ADL  Eating/Feeding: Simulated;Set up Where Assessed - Eating/Feeding: Edge of bed Grooming: Simulated;Minimal assistance Where Assessed - Grooming: Supported standing Upper Body Bathing: Simulated;Minimal assistance Where Assessed - Upper Body Bathing: Unsupported sitting Lower Body Bathing: Simulated;Supervision/safety Where Assessed - Lower Body Bathing: Unsupported sit to stand Upper Body Dressing: Simulated;Minimal assistance Where Assessed - Upper Body Dressing: Unsupported sitting Lower Body Dressing: Performed;Supervision/safety Where Assessed - Lower Body Dressing: Unsupported sit to stand Toilet Transfer: Performed;Independent Toilet Transfer Method: Other (comment) (ambulate without assistive device) Toilet Transfer Equipment: Comfort height toilet Toileting - Clothing Manipulation and Hygiene: Performed;Supervision/safety Where Assessed - Toileting Clothing Manipulation and Hygiene: Sit to stand from  3-in-1 or toilet Tub/Shower Transfer: Simulated;Modified independent Transfers/Ambulation Related to ADLs: Pt is independent with mobility just slightly groggy secondary to medication. ADL Comments: Pt educated on AAROM exercises for the left shoulder, elbow, and digits.  Also educated on adaptive techniques for dressing and need for assist with bathing tasks.  Pt will have assistance from her girlfriend.     OT Problem List: Decreased strength;Decreased range of motion;Increased edema;Impaired sensation     Visit Information  Last OT Received On: 04/16/12 Assistance Needed: +1    Subjective Data  Subjective: My arm hurts a little bit. Patient Stated Goal: Did not state but agreeable to participate in therapy.   Prior Functioning     Home Living Lives With: Other (Comment) (boyfriend) Available Help at Discharge: Friend(s) Type of Home: Apartment Home Adaptive Equipment: None Prior Function Level of Independence: Independent Able to Take Stairs?: Yes Driving: Yes Communication Communication: No difficulties Dominant Hand: Right         Vision/Perception Vision - History Baseline Vision: No visual deficits Patient Visual Report: No change from baseline Vision - Assessment Eye Alignment: Within Functional Limits Perception Perception: Within Functional Limits Praxis Praxis: Intact   Cognition  Cognition Overall Cognitive Status: Appears within functional limits for tasks assessed/performed Arousal/Alertness: Awake/alert Orientation Level: Appears intact for tasks assessed Behavior During Session: [facility] for tasks performed    Extremity/Trunk Assessment Right Upper Extremity Assessment RUE ROM/Strength/Tone: Within functional levels RUE Sensation: WFL - Light Touch RUE Coordination: WFL - gross/fine motor Left Upper Extremity Assessment LUE ROM/Strength/Tone: Deficits LUE ROM/Strength/Tone Deficits: Pt in short arm cast from MPs to elbow.  Demonstrates  limited active digit flexion approximately 20%, able to tolerate PROM to 70% within limits of the cast.  Elbow AAROM WFLS and Shoulder AAROM WFLS.  Pt with reports of  slight shoulder pain from previous falls.   LUE Sensation: Deficits LUE Sensation Deficits: increased numbness in the digits compared to the right hand LUE Coordination: Deficits LUE Coordination Deficits: Pt limited by cast and decreased AAROM Trunk Assessment Trunk Assessment: Normal     Mobility Bed Mobility Bed Mobility: Supine to Sit Supine to Sit: 7: Independent Transfers Transfers: Sit to Stand;Stand to Sit Sit to Stand: 7: Independent Stand to Sit: 7: Independent     Exercise     Balance Balance Balance Assessed: Yes Dynamic Standing Balance Dynamic Standing - Balance Support: No upper extremity supported Dynamic Standing - Level of Assistance: 7: Independent   End of Session OT - End of Session Patient left: in bed;with call bell/phone within reach;with family/visitor present Nurse Communication: Mobility status  GO Functional Assessment Tool Used: Clinical Judgement Functional Limitation: Self care Self Care Current Status (Z6109): At least 80 percent but less than 100 percent impaired, limited or restricted Self Care Goal Status (U0454): At least 80 percent but less than 100 percent impaired, limited or restricted Self Care Discharge Status [phone]): At least 80 percent but less than 100 percent impaired, limited or restricted   Isaiah Torok OTR/L Pager number [phone] 04/16/2012, 12:38 PM

## 2012-04-16 NOTE — H&P (Signed)
Wendy Mccarty, Wendy Mccarty              ACCOUNT NO.:  [id number]  MEDICAL RECORD NO.:  [id number]  LOCATION:  5N03C                        FACILITY:  [facility]  PHYSICIAN:  Dionne Ano. Oswin Johal, M.D.DATE OF BIRTH:  12-11-1990  DATE OF ADMISSION:  04/15/2012 DATE OF DISCHARGE:  04/16/2012                             HISTORY & PHYSICAL   PREOPERATIVE DIAGNOSIS:  Median nerve compression with associated distal radius fracture, comminuted complex greater than 3-part, left upper extremity.  POSTOPERATIVE DIAGNOSIS:  Median nerve compression with associated distal radius fracture, comminuted complex greater than 3-part, left upper extremity.  PROCEDURE: 1. Open reduction and internal fixation of distal radius fracture with     DVR narrow plate and screw construct. 2. Sliding brachioradialis tenotomy. 3. Open left carpal tunnel release. 4. Closed treatment ulnar styloid fracture with good stable distal     radioulnar joint mechanics. 5. Stress radiography.  SURGEON:  Dionne Ano. Amanda Pea, M.D.  ASSISTANT:  Karie Chimera, P.A.-C.  COMPLICATION:  None.  ANESTHESIA:  General.  TOURNIQUET TIME:  Less than an hour.  INDICATIONS:  The patient is a pleasant female, presents with a comminuted complex wrist fracture.  We performed preliminary reduction in the office given the comminution, carpal tunnel symptoms, and failure of conservative management.  She desires to proceed with the above- mentioned operative intervention.  I have counseled her in regard to risks and benefits of surgery and she desires to proceed.  OPERATIVE PROCEDURE:  The patient was seen by myself and anesthesia, taken to the operative suite, and underwent a smooth induction of general anesthesia.  Preoperatively, Dr. Michelle Piper performed a clavicular block which was in excellent working fashion.  The patient tolerated this very well.  There were no complicating features.  Following this, she was taken to the operative arena,  underwent a thorough prep and drape, and time-out was called.  The patient tolerated this well.  There were no complicating issues.  Once this was complete, the patient then underwent elevation of the arm.  Tourniquet was insufflated to 250 mmHg. Time-out was called.  Pre and postop check was complete.  Volar-radial incision was made.  Dissection was carried down FCR tendon sheath, it was incised proximally and distally along the length of tendon and fasciotomy performed.  Following this pronator was swept ulnarly with carpal canal contents after incising it with knife blade.  The fracture was reduced, manipulated held provisionally, and following this, a narrow DVR plate and screw construct was then applied without difficulty, which the patient tolerated well.  There were no complicating features.  I was able to achieve excellent radial height inclination and volar tilt.  I should note that, I performed a sliding brachioradialis tenotomy.  The less and deforming forces of the styloid.  She tolerated this well. Following this, I irrigated copiously, closed the pronator with Vicryl. Once this was done, I then stress tested the distal radioulnar joint. The ulnar styloid was stable and thus we chose to perform close treatment of the ulna styloid fracture.  The distal radioulnar joint mechanics looked excellent.  Stress radiography revealed good-looking scaphoid lunate and triquetrum bone alignment and Gilula lines looked to be in excellent position.  I was quite  pleased with these findings. Following this, I then performed very careful and cautious irrigation, followed by a small 1.5-cm incision over the transverse carpal ligament distally.  Dissection was carried down.  Transverse carpal ligament was released fully distally until fat pad egressed nicely.  There was blood in the carpal canal indicating median nerve injury/compression and contusion.  I released the proximal leaflet and  portions of the antebrachial fascia without difficulty and there were no complicating features.  Following this, the patient then underwent a very careful and cautious look at the tissue, all looked well.  Hemostasis was secured with the tourniquet deflated and following this, the patient then underwent closure of the wound with Prolene.  Adaptic, Xeroform, and sterile dressing was applied.  She tolerated this well.  There were no complicating features.  She will be admitted overnight for IV antibiotics, general postop observation, and other measures.  Elevation, OT consult, and other measures will be adhered to.  It has been a pleasure to participate in her care.  We look forward to participating in postop recovery.  We will plan for a standard DVR postop algorithm. Sutures out at 10-14 days.  Cast to be applied at 4 weeks interval range of motion under the direction of therapy and at 8 weeks strengthening predicated on all looking well.     Dionne Ano. Amanda Pea, M.D.     [facility]  D:  04/15/2012  T:  04/16/2012  Job:  [id number]

## 2013-04-22 ENCOUNTER — Encounter: Payer: Self-pay | Admitting: Obstetrics & Gynecology

## 2013-04-22 ENCOUNTER — Ambulatory Visit (INDEPENDENT_AMBULATORY_CARE_PROVIDER_SITE_OTHER): Payer: 59 | Admitting: Obstetrics & Gynecology

## 2013-04-22 VITALS — BP 112/76 | HR 100 | Temp 98.3°F | Ht 65.5 in | Wt 115.0 lb

## 2013-04-22 MED ORDER — METRONIDAZOLE 500 MG PO TABS: 500.0000 mg | ORAL_TABLET | Freq: Two times a day (BID) | ORAL | Status: AC

## 2013-04-22 MED ORDER — VALACYCLOVIR HCL 1 G PO TABS: 1000.0000 mg | ORAL_TABLET | Freq: Two times a day (BID) | ORAL | Status: AC

## 2013-04-22 MED ORDER — VALACYCLOVIR HCL 500 MG PO TABS: 500.0000 mg | ORAL_TABLET | Freq: Every day | ORAL | Status: DC

## 2013-04-22 NOTE — Progress Notes (Unsigned)
Subjective:     Wendy Mccarty is a 23 y.o. female here for a routine exam.  Current complaints: Patient is in the office today for an Annual Exam. Patient states she is having a thick/ watery yellow discharge. Patient states now she is having blisters. Patient states she might have made it worse by using monastat. Patient states she is having some itching, irritation and some burning but only when she touches the blister. Patient denies any vaginal odor. Patient states she would like pelvic exam. Personal health questionnaire reviewed: yes.   Gynecologic History Patient's last menstrual period was 04/04/2013. Contraception: OCP (estrogen/progesterone) Last Pap: 2014. Results were: normal (Unsure)  Obstetric History OB History  Gravida Para Term Preterm AB SAB TAB Ectopic Multiple Living  1    1  1        # Outcome Date GA Lbr Len/2nd Weight Sex Delivery Anes PTL Lv  1 TAB 2011               The following portions of the patient's history were reviewed and updated as appropriate: allergies, current medications, past family history, past medical history, past social history, past surgical history and problem list.  Review of Systems {ros; complete:30496}    Objective:    {exam; complete:18323}    Assessment:    Healthy female exam.    Plan:    {plan:19193}

## 2013-04-23 LAB — HIV ANTIBODY (ROUTINE TESTING W REFLEX): HIV: NONREACTIVE

## 2013-04-23 LAB — RPR

## 2013-04-27 ENCOUNTER — Encounter: Payer: Self-pay | Admitting: Obstetrics & Gynecology

## 2013-04-30 ENCOUNTER — Encounter: Payer: Self-pay | Admitting: Obstetrics & Gynecology

## 2013-04-30 DIAGNOSIS — A6009 Herpesviral infection of other urogenital tract: Secondary | ICD-10-CM | POA: Insufficient documentation

## 2013-04-30 DIAGNOSIS — A749 Chlamydial infection, unspecified: Secondary | ICD-10-CM | POA: Insufficient documentation

## 2013-05-02 ENCOUNTER — Other Ambulatory Visit: Payer: Self-pay | Admitting: *Deleted

## 2013-05-02 ENCOUNTER — Encounter: Payer: Self-pay | Admitting: Obstetrics & Gynecology

## 2013-05-02 DIAGNOSIS — A749 Chlamydial infection, unspecified: Secondary | ICD-10-CM

## 2013-05-02 MED ORDER — AZITHROMYCIN 250 MG PO TABS: 1000.0000 mg | ORAL_TABLET | Freq: Once | ORAL | Status: DC

## 2013-05-03 ENCOUNTER — Encounter: Payer: Self-pay | Admitting: Obstetrics & Gynecology

## 2013-05-10 ENCOUNTER — Other Ambulatory Visit: Payer: Self-pay | Admitting: *Deleted

## 2013-05-10 DIAGNOSIS — B3731 Acute candidiasis of vulva and vagina: Secondary | ICD-10-CM

## 2013-05-10 DIAGNOSIS — B373 Candidiasis of vulva and vagina: Secondary | ICD-10-CM

## 2013-05-10 MED ORDER — FLUCONAZOLE 150 MG PO TABS: 150.0000 mg | ORAL_TABLET | ORAL | Status: DC

## 2013-05-11 ENCOUNTER — Other Ambulatory Visit: Payer: 59

## 2013-05-12 ENCOUNTER — Encounter: Payer: Self-pay | Admitting: Obstetrics

## 2013-07-20 ENCOUNTER — Ambulatory Visit: Payer: 59 | Admitting: Obstetrics & Gynecology

## 2013-08-10 ENCOUNTER — Ambulatory Visit: Payer: 59 | Admitting: Obstetrics & Gynecology

## 2013-12-26 ENCOUNTER — Encounter: Payer: Self-pay | Admitting: Obstetrics & Gynecology

## 2014-02-20 ENCOUNTER — Encounter: Payer: Self-pay | Admitting: *Deleted

## 2014-02-21 ENCOUNTER — Encounter: Payer: Self-pay | Admitting: Obstetrics & Gynecology

## 2014-04-21 ENCOUNTER — Ambulatory Visit (INDEPENDENT_AMBULATORY_CARE_PROVIDER_SITE_OTHER): Payer: 59 | Admitting: Certified Nurse Midwife

## 2014-04-21 ENCOUNTER — Encounter: Payer: Self-pay | Admitting: Certified Nurse Midwife

## 2014-04-21 VITALS — BP 120/78 | HR 85 | Temp 98.7°F | Ht 66.0 in | Wt 118.0 lb

## 2014-04-21 DIAGNOSIS — R102 Pelvic and perineal pain: Secondary | ICD-10-CM

## 2014-04-21 DIAGNOSIS — N760 Acute vaginitis: Secondary | ICD-10-CM

## 2014-04-21 DIAGNOSIS — A499 Bacterial infection, unspecified: Secondary | ICD-10-CM

## 2014-04-21 DIAGNOSIS — Z30011 Encounter for initial prescription of contraceptive pills: Secondary | ICD-10-CM | POA: Diagnosis not present

## 2014-04-21 DIAGNOSIS — B9689 Other specified bacterial agents as the cause of diseases classified elsewhere: Secondary | ICD-10-CM | POA: Insufficient documentation

## 2014-04-21 LAB — POCT URINALYSIS DIPSTICK
Bilirubin, UA: NEGATIVE
GLUCOSE UA: NEGATIVE
KETONES UA: NEGATIVE
LEUKOCYTES UA: NEGATIVE
Nitrite, UA: NEGATIVE
PH UA: 5
SPEC GRAV UA: 1.02
Urobilinogen, UA: NEGATIVE

## 2014-04-21 MED ORDER — NORETHIN-ETH ESTRAD-FE BIPHAS 1 MG-10 MCG / 10 MCG PO TABS: 1.0000 | ORAL_TABLET | Freq: Every day | ORAL | Status: DC

## 2014-04-21 MED ORDER — METRONIDAZOLE 0.75 % VA GEL: 1.0000 | Freq: Two times a day (BID) | VAGINAL | Status: DC

## 2014-04-21 NOTE — Progress Notes (Signed)
Patient ID: Wendy Mccarty, female   DOB: 1990/05/26, 24 y.o.   MRN: [id number]    Subjective:Wendy Mccarty     Wendy Mccarty is a 24 y.o. female here for a routine exam.  Current complaints: occasional cramping type abdominal pain, could be flatus. Does report some episodes of constipation.  Denies any bladder problems, or acute abdominal pain.  No recent infections/abdominal trauma.  Employed full time.  [facility]      Personal health questionnaire:  Is patient Ashkenazi Jewish, have a family history of breast and/or ovarian cancer: no Is there a family history of uterine cancer diagnosed at age < 1850, gastrointestinal cancer, urinary tract cancer, family member who is a Personnel officerLynch syndrome-associated carrier: no Is the patient overweight and hypertensive, family history of diabetes, personal history of gestational diabetes, preeclampsia or PCOS: no Is patient over 755, have PCOS,  family history of premature CHD under age 26, diabetes, smoke, have hypertension or peripheral artery disease:  no At any time, has a partner hit, kicked or otherwise hurt or frightened you?: no Over the past 2 weeks, have you felt down, depressed or hopeless?: no Over the past 2 weeks, have you felt little interest or pleasure in doing things?:no   Gynecologic History Patient's last menstrual period was 04/04/2014. Contraception: OCP (estrogen/progesterone) Last Pap: unknown. Results were: unknown   Obstetric History OB History  Gravida Para Term Preterm AB SAB TAB Ectopic Multiple Living  1    1  1        # Outcome Date GA Lbr Len/2nd Weight Sex Delivery Anes PTL Lv  1 TAB 2011              Past Medical History  Diagnosis Date  . History of blood transfusion     Past Surgical History  Procedure Laterality Date  . Tooth extraction    . Open reduction internal fixation (orif) distal radial fracture Left 04/15/2012    Procedure: OPEN REDUCTION INTERNAL  FIXATION (ORIF) LEFT DISTAL RADIUS FRACTURE;  Surgeon: Dominica SeverinWilliam Gramig, MD;  Location: [facility] OR;  Service: Orthopedics;  Laterality: Left;  . Carpal tunnel release Left 04/15/2012    Procedure: OPEN CARPAL TUNNEL RELEASE WITH REPAIR RECONSTRUCTION AS NECESSARY ;  Surgeon: Dominica SeverinWilliam Gramig, MD;  Location: [facility] OR;  Service: Orthopedics;  Laterality: Left;     Current outpatient prescriptions:  .  azithromycin (ZITHROMAX) 250 MG tablet, Take 4 tablets (1,000 mg total) by mouth once., Disp: 4 tablet, Rfl: 0 .  fluconazole (DIFLUCAN) 150 MG tablet, Take 1 tablet (150 mg total) by mouth every other day., Disp: 2 tablet, Rfl: 0 .  levonorgestrel-ethinyl estradiol (SRONYX) 0.1-20 MG-MCG tablet, Take 1 tablet by mouth daily., Disp: , Rfl:  .  valACYclovir (VALTREX) 500 MG tablet, Take 1 tablet (500 mg total) by mouth daily., Disp: 30 tablet, Rfl: 6 .  ALPRAZolam (XANAX) 0.5 MG tablet, Take 1 tablet (0.5 mg total) by mouth every 6 (six) hours as needed for anxiety. (Patient not taking: Reported on 04/21/2014), Disp: 35 tablet, Rfl: 0 .  methocarbamol (ROBAXIN) 500 MG tablet, Take 1 tablet (500 mg total) by mouth every 6 (six) hours as needed. (Patient not taking: Reported on 04/21/2014), Disp: 45 tablet, Rfl: 0 .  metroNIDAZOLE (METROGEL VAGINAL) 0.75 % vaginal gel, Place 1 Applicatorful vaginally 2 (two) times daily., Disp: 70 g, Rfl: 0 .  Norethindrone-Ethinyl Estradiol-Fe Biphas (LO LOESTRIN FE) 1 MG-10 MCG / 10 MCG tablet, Take  1 tablet by mouth daily., Disp: 1 Package, Rfl: 11 .  oxyCODONE (OXY IR/ROXICODONE) 5 MG immediate release tablet, Take 5-10 mg by mouth every 4 (four) hours as needed for pain., Disp: , Rfl:  .  oxyCODONE-acetaminophen (PERCOCET/ROXICET) 5-325 MG per tablet, Take 1-2 tablets by mouth every 6 (six) hours as needed for pain. (Patient not taking: Reported on 04/21/2014), Disp: 20 tablet, Rfl: 0 No Known Allergies  History  Substance Use Topics  . Smoking status: Never Smoker   . Smokeless  tobacco: Never Used  . Alcohol Use: Yes     Comment: occasionally     Family History  Problem Relation Age of Onset  . Hypertension Mother   . Hypertension Maternal Grandfather       Review of Systems  Constitutional: negative for fatigue and weight loss Respiratory: negative for cough and wheezing Cardiovascular: negative for chest pain, fatigue and palpitations Gastrointestinal: negative for abdominal pain and change in bowel habits Musculoskeletal:negative for myalgias Neurological: negative for gait problems and tremors Behavioral/Psych: negative for abusive relationship, depression Endocrine: negative for temperature intolerance   Genitourinary:negative for abnormal menstrual periods, genital lesions, hot flashes, and sexual problems. Reports chronic BV and "fishy" vaginal discharge Integument/breast: negative for breast lump, breast tenderness, nipple discharge and skin lesion(s)    Objective:       BP 120/78 mmHg  Pulse 85  Temp(Src) 98.7 F (37.1 C)  Ht  (1.676 m)  Wt 53.524 kg (118 lb)  BMI 19.05 kg/m2  LMP 04/04/2014 General:   alert, cooperative, very thin stature  Skin:   no rash or abnormalities  Lungs:   clear to auscultation bilaterally  Heart:   regular rate and rhythm, S1, S2 normal, no murmur, click, rub or gallop  Breasts:   normal without suspicious masses, skin or nipple changes or axillary nodes  Abdomen:  normal findings: no organomegaly, soft, non-tender and no hernia  Pelvis:  External genitalia: normal general appearance Urinary system: urethral meatus normal and bladder without fullness, nontender Vaginal: normal without tenderness, induration or masses Cervix: normal appearance. Copious thin gray discharge noted on exam.  Adnexa: normal bimanual exam Uterus: anteverted and non-tender, normal size   Lab Review Urine pregnancy test Labs reviewed yes Radiologic studies reviewed N/A   Assessment:    Healthy female exam.  Chronic  BV   Plan:    Education reviewed: low fat, low cholesterol diet, safe sex/STD prevention, self breast exams and skin cancer screening. Contraception: OCP (estrogen/progesterone). Follow up in: 1 year.   Meds ordered this encounter  Medications  . metroNIDAZOLE (METROGEL VAGINAL) 0.75 % vaginal gel    Sig: Place 1 Applicatorful vaginally 2 (two) times daily.    Dispense:  70 g    Refill:  0  . Norethindrone-Ethinyl Estradiol-Fe Biphas (LO LOESTRIN FE) 1 MG-10 MCG / 10 MCG tablet    Sig: Take 1 tablet by mouth daily.    Dispense:  1 Package    Refill:  11   No orders of the defined types were placed in this encounter.   Need to obtain previous records from [facility].

## 2014-04-21 NOTE — Addendum Note (Signed)
Addended by: Odessa FlemingBOHNE, Anusha Claus M on: 04/21/2014 04:28 PM   Modules accepted: Orders

## 2014-04-24 LAB — PAP IG W/ RFLX HPV ASCU

## 2014-04-25 LAB — SURESWAB, VAGINOSIS/VAGINITIS PLUS
ATOPOBIUM VAGINAE: NOT DETECTED Log (cells/mL)
BV CATEGORY: UNDETERMINED — AB
C. TROPICALIS, DNA: NOT DETECTED
C. albicans, DNA: DETECTED — AB
C. glabrata, DNA: NOT DETECTED
C. parapsilosis, DNA: NOT DETECTED
C. trachomatis RNA, TMA: NOT DETECTED
GARDNERELLA VAGINALIS: 6 Log (cells/mL)
LACTOBACILLUS SPECIES: 7.5 Log (cells/mL)
MEGASPHAERA SPECIES: NOT DETECTED Log (cells/mL)
N. gonorrhoeae RNA, TMA: NOT DETECTED
T. VAGINALIS RNA, QL TMA: NOT DETECTED

## 2014-04-27 ENCOUNTER — Other Ambulatory Visit: Payer: Self-pay | Admitting: *Deleted

## 2014-04-27 DIAGNOSIS — B379 Candidiasis, unspecified: Secondary | ICD-10-CM

## 2014-04-27 MED ORDER — FLUCONAZOLE 150 MG PO TABS: 150.0000 mg | ORAL_TABLET | Freq: Once | ORAL | Status: DC

## 2014-04-27 NOTE — Progress Notes (Signed)
Pt treated of BV at time of last visit and was not given treatment for yeast.  Diflucan sent to pharmacy for +yeast infection.   Pt advised to call office if any problems.

## 2014-07-18 ENCOUNTER — Telehealth: Payer: Self-pay | Admitting: *Deleted

## 2014-07-18 MED ORDER — FLUCONAZOLE 150 MG PO TABS: 150.0000 mg | ORAL_TABLET | Freq: Once | ORAL | Status: DC

## 2014-07-18 NOTE — Telephone Encounter (Signed)
Patient is requesting refill of Diflucan. OK to RF per Dr Clearance CootsHarper

## 2014-07-25 ENCOUNTER — Other Ambulatory Visit: Payer: Self-pay | Admitting: *Deleted

## 2014-07-25 MED ORDER — VALACYCLOVIR HCL 500 MG PO TABS: 500.0000 mg | ORAL_TABLET | Freq: Every day | ORAL | Status: DC

## 2014-10-04 ENCOUNTER — Telehealth: Payer: Self-pay | Admitting: *Deleted

## 2014-10-04 NOTE — Telephone Encounter (Signed)
Patient contacted the office to ask when her last appointment was and what test were performed. Patient advised of the date of her last appointment and test performed as well as results.

## 2014-11-09 ENCOUNTER — Other Ambulatory Visit: Payer: Self-pay | Admitting: Certified Nurse Midwife

## 2014-11-09 DIAGNOSIS — N760 Acute vaginitis: Principal | ICD-10-CM

## 2014-11-09 DIAGNOSIS — B9689 Other specified bacterial agents as the cause of diseases classified elsewhere: Secondary | ICD-10-CM

## 2015-02-08 ENCOUNTER — Ambulatory Visit (INDEPENDENT_AMBULATORY_CARE_PROVIDER_SITE_OTHER): Payer: 59 | Admitting: Certified Nurse Midwife

## 2015-02-08 DIAGNOSIS — Z538 Procedure and treatment not carried out for other reasons: Secondary | ICD-10-CM

## 2015-02-08 NOTE — Progress Notes (Deleted)
Patient ID: Wendy Mccarty, female   DOB: 1990/09/22, 24 y.o.   MRN: [id number]    Subjective:      Wendy Mccarty is a 24 y.o. female here for a routine exam.  Current complaints: none.    Personal health questionnaire:  Is patient Ashkenazi Jewish, have a family history of breast and/or ovarian cancer: no Is there a family history of uterine cancer diagnosed at age < 78, gastrointestinal cancer, urinary tract cancer, family member who is a Personnel officer syndrome-associated carrier: no Is the patient overweight and hypertensive, family history of diabetes, personal history of gestational diabetes, preeclampsia or PCOS: no Is patient over 75, have PCOS,  family history of premature CHD under age 70, diabetes, smoke, have hypertension or peripheral artery disease:  yes At any time, has a partner hit, kicked or otherwise hurt or frightened you?: no Over the past 2 weeks, have you felt down, depressed or hopeless?: no Over the past 2 weeks, have you felt little interest or pleasure in doing things?:no   Gynecologic History No LMP recorded. Contraception: OCP (estrogen/progesterone) Last Pap: ***. Results were: {norm/abn:16337} Last mammogram: ***. Results were: {norm/abn:16337}  Obstetric History OB History  Gravida Para Term Preterm AB SAB TAB Ectopic Multiple Living  # Outcome Date GA Lbr Len/2nd Weight Sex Delivery Anes PTL Lv  1 TAB 2011              Past Medical History  Diagnosis Date  . History of blood transfusion     Past Surgical History  Procedure Laterality Date  . Tooth extraction    . Open reduction internal fixation (orif) distal radial fracture Left 04/15/2012    Procedure: OPEN REDUCTION INTERNAL FIXATION (ORIF) LEFT DISTAL RADIUS FRACTURE;  Surgeon: Dominica Severin, MD;  Location: [facility];  Service: Orthopedics;  Laterality: Left;  . Carpal tunnel release Left 04/15/2012    Procedure: OPEN CARPAL TUNNEL RELEASE WITH REPAIR RECONSTRUCTION AS NECESSARY ;   Surgeon: Dominica Severin, MD;  Location: [facility] OR;  Service: Orthopedics;  Laterality: Left;     Current outpatient prescriptions:  .  ALPRAZolam (XANAX) 0.5 MG tablet, Take 1 tablet (0.5 mg total) by mouth every 6 (six) hours as needed for anxiety. (Patient not taking: Reported on 04/21/2014), Disp: 35 tablet, Rfl: 0 .  azithromycin (ZITHROMAX) 250 MG tablet, Take 4 tablets (1,000 mg total) by mouth once., Disp: 4 tablet, Rfl: 0 .  fluconazole (DIFLUCAN) 150 MG tablet, Take 1 tablet (150 mg total) by mouth once., Disp: 1 tablet, Rfl: 0 .  levonorgestrel-ethinyl estradiol (SRONYX) 0.1-20 MG-MCG tablet, Take 1 tablet by mouth daily., Disp: , Rfl:  .  methocarbamol (ROBAXIN) 500 MG tablet, Take 1 tablet (500 mg total) by mouth every 6 (six) hours as needed. (Patient not taking: Reported on 04/21/2014), Disp: 45 tablet, Rfl: 0 .  metroNIDAZOLE (METROGEL) 0.75 % vaginal gel, INSERT ONE APPLICATORFUL VAGINALLY TWICE DAILY, Disp: 70 g, Rfl: 2 .  Norethindrone-Ethinyl Estradiol-Fe Biphas (LO LOESTRIN FE) 1 MG-10 MCG / 10 MCG tablet, Take 1 tablet by mouth daily., Disp: 1 Package, Rfl: 11 .  oxyCODONE (OXY IR/ROXICODONE) 5 MG immediate release tablet, Take 5-10 mg by mouth every 4 (four) hours as needed for pain., Disp: , Rfl:  .  oxyCODONE-acetaminophen (PERCOCET/ROXICET) 5-325 MG per tablet, Take 1-2 tablets by mouth every 6 (six) hours as needed for pain. (Patient not taking: Reported on 04/21/2014), Disp: 20 tablet, Rfl: 0 .  valACYclovir (VALTREX) 500 MG tablet, Take 1 tablet (500 mg total) by mouth daily., Disp: 30 tablet, Rfl: 6 No Known Allergies  Social History  Substance Use Topics  . Smoking status: Never Smoker   . Smokeless tobacco: Never Used  . Alcohol Use: Yes     Comment: occasionally     Family History  Problem Relation Age of Onset  . Hypertension Mother   . Hypertension Maternal Grandfather       Review of Systems  Constitutional: negative for fatigue and weight loss Respiratory:  negative for cough and wheezing Cardiovascular: negative for chest pain, fatigue and palpitations Gastrointestinal: negative for abdominal pain and change in bowel habits Musculoskeletal:negative for myalgias Neurological: negative for gait problems and tremors Behavioral/Psych: negative for abusive relationship, depression Endocrine: negative for temperature intolerance   Genitourinary:negative for abnormal menstrual periods, genital lesions, hot flashes, sexual problems and vaginal discharge Integument/breast: negative for breast lump, breast tenderness, nipple discharge and skin lesion(s)    Objective:       There were no vitals taken for this visit. General:   alert  Skin:   no rash or abnormalities  Lungs:   clear to auscultation bilaterally  Heart:   regular rate and rhythm, S1, S2 normal, no murmur, click, rub or gallop  Breasts:   normal without suspicious masses, skin or nipple changes or axillary nodes  Abdomen:  normal findings: no organomegaly, soft, non-tender and no hernia  Pelvis:  External genitalia: normal general appearance Urinary system: urethral meatus normal and bladder without fullness, nontender Vaginal: normal without tenderness, induration or masses Cervix: normal appearance Adnexa: normal bimanual exam Uterus: anteverted and non-tender, normal size   Lab Review Urine pregnancy test Labs reviewed {YES NO:22349} Radiologic studies reviewed {YES NO:22349}  ***% of *** min visit spent on counseling and coordination of care.   Assessment:    Healthy female exam.    Plan:    {plan:19193}   No orders of the defined types were placed in this encounter.   No orders of the defined types were placed in this encounter.   Need to obtain previous records Possible management options include:*** Follow up as needed. ***

## 2015-02-15 ENCOUNTER — Ambulatory Visit: Payer: 59 | Admitting: Certified Nurse Midwife

## 2015-02-15 ENCOUNTER — Ambulatory Visit (INDEPENDENT_AMBULATORY_CARE_PROVIDER_SITE_OTHER): Payer: 59 | Admitting: Certified Nurse Midwife

## 2015-02-15 ENCOUNTER — Encounter: Payer: Self-pay | Admitting: Certified Nurse Midwife

## 2015-02-15 VITALS — BP 124/82 | HR 109 | Temp 98.2°F | Ht 66.0 in | Wt 121.0 lb

## 2015-02-15 DIAGNOSIS — K5901 Slow transit constipation: Secondary | ICD-10-CM | POA: Diagnosis not present

## 2015-02-15 DIAGNOSIS — K5900 Constipation, unspecified: Secondary | ICD-10-CM | POA: Insufficient documentation

## 2015-02-15 DIAGNOSIS — Z113 Encounter for screening for infections with a predominantly sexual mode of transmission: Secondary | ICD-10-CM | POA: Diagnosis not present

## 2015-02-15 MED ORDER — POLYETHYLENE GLYCOL 3350 17 GM/SCOOP PO POWD: 17.0000 g | Freq: Every day | ORAL | Status: DC

## 2015-02-15 NOTE — Progress Notes (Signed)
Patient ID: Wendy Mccarty, female   DOB: 01-21-1991, 24 y.o.   MRN: [id number]   Chief Complaint  Patient presents with  . STD testing    HPI Wendy Mccarty is a 24 y.o. female.  Here for STD testing.  Cancelled her GYN exam earlier this month.  Desires swab only STD screening, declines blood work today.  Educated on the need for a annual physical exam.  States that she only has a bowel movement about twice a week, has increased her exercising, fluids, fiber and taken stool softener without relief.    HPI  Past Medical History  Diagnosis Date  . History of blood transfusion     Past Surgical History  Procedure Laterality Date  . Tooth extraction    . Open reduction internal fixation (orif) distal radial fracture Left 04/15/2012    Procedure: OPEN REDUCTION INTERNAL FIXATION (ORIF) LEFT DISTAL RADIUS FRACTURE;  Surgeon: Dominica Severin, MD;  Location: [facility];  Service: Orthopedics;  Laterality: Left;  . Carpal tunnel release Left 04/15/2012    Procedure: OPEN CARPAL TUNNEL RELEASE WITH REPAIR RECONSTRUCTION AS NECESSARY ;  Surgeon: Dominica Severin, MD;  Location: [facility];  Service: Orthopedics;  Laterality: Left;    Family History  Problem Relation Age of Onset  . Hypertension Mother   . Hypertension Maternal Grandfather     Social History Social History  Substance Use Topics  . Smoking status: Never Smoker   . Smokeless tobacco: Never Used  . Alcohol Use: 0.0 oz/week    0 Standard drinks or equivalent per week     Comment: occasionally     No Known Allergies  Current Outpatient Prescriptions  Medication Sig Dispense Refill  . Norethindrone-Ethinyl Estradiol-Fe Biphas (LO LOESTRIN FE) 1 MG-10 MCG / 10 MCG tablet Take 1 tablet by mouth daily. 1 Package 11  . valACYclovir (VALTREX) 500 MG tablet Take 1 tablet (500 mg total) by mouth daily. 30 tablet 6  . polyethylene glycol powder (GLYCOLAX/MIRALAX) powder Take 17 g by mouth daily. 500 g 0   No current facility-administered  medications for this visit.    Review of Systems Review of Systems Constitutional: negative for fatigue and weight loss Respiratory: negative for cough and wheezing Cardiovascular: negative for chest pain, fatigue and palpitations Gastrointestinal: negative for abdominal pain and change in bowel habits, + constipation Genitourinary:negative Integument/breast: negative for nipple discharge Musculoskeletal:negative for myalgias Neurological: negative for gait problems and tremors Behavioral/Psych: negative for abusive relationship, depression Endocrine: negative for temperature intolerance     Blood pressure 124/82, pulse 109, temperature 98.2 F (36.8 C), height  (1.676 m), weight 121 lb (54.885 kg), last menstrual period 02/07/2015.  Physical Exam Physical Exam General:   alert  Skin:   no rash or abnormalities  Lungs:   clear to auscultation bilaterally  Heart:   regular rate and rhythm, S1, S2 normal, no murmur, click, rub or gallop  Breasts:   deferred  Abdomen:  normal findings: no organomegaly, soft, non-tender and no hernia  Pelvis:  External genitalia: normal general appearance Urinary system: urethral meatus normal and bladder without fullness, nontender Vaginal: normal without tenderness, induration or masses Cervix: no CMT Adnexa: normal bimanual exam Uterus: anteverted and non-tender, normal size    50% of 15 min visit spent on counseling and coordination of care.   Data Reviewed Previous medical hx, meds  Assessment     STD screening examEmalee Kniesit constipation     Plan    Orders Placed This Encounter  Procedures  . SureSwab, Vaginosis/Vaginitis Plus  . Ambulatory referral to Gastroenterology    Referral Priority:  Routine    Referral Type:  Consultation    Referral Reason:  Specialty Services Required    Number of Visits Requested:  1   Meds ordered this encounter  Medications  . polyethylene glycol powder (GLYCOLAX/MIRALAX) powder     Sig: Take 17 g by mouth daily.    Dispense:  500 g    Refill:  0      Follow up with annual exam.

## 2015-02-16 ENCOUNTER — Encounter: Payer: Self-pay | Admitting: Physician Assistant

## 2015-02-16 NOTE — Progress Notes (Signed)
Patient ID: Wendy Mccarty, female   DOB: Jan 17, 1991, 24 y.o.   MRN: [id number]  Patient did not stay for exam, rescheduled.

## 2015-02-21 LAB — SURESWAB, VAGINOSIS/VAGINITIS PLUS
Atopobium vaginae: NOT DETECTED Log (cells/mL)
BV CATEGORY: UNDETERMINED — AB
C. ALBICANS, DNA: DETECTED — AB
C. GLABRATA, DNA: NOT DETECTED
C. TRACHOMATIS RNA, TMA: NOT DETECTED
C. TROPICALIS, DNA: NOT DETECTED
C. parapsilosis, DNA: NOT DETECTED
GARDNERELLA VAGINALIS: 7.7 Log (cells/mL)
LACTOBACILLUS SPECIES: 6.6 Log (cells/mL)
MEGASPHAERA SPECIES: NOT DETECTED Log (cells/mL)
N. gonorrhoeae RNA, TMA: NOT DETECTED
T. VAGINALIS RNA, QL TMA: NOT DETECTED

## 2015-02-22 ENCOUNTER — Other Ambulatory Visit: Payer: Self-pay | Admitting: Certified Nurse Midwife

## 2015-02-22 DIAGNOSIS — B3731 Acute candidiasis of vulva and vagina: Secondary | ICD-10-CM

## 2015-02-22 DIAGNOSIS — B9689 Other specified bacterial agents as the cause of diseases classified elsewhere: Secondary | ICD-10-CM

## 2015-02-22 DIAGNOSIS — N760 Acute vaginitis: Principal | ICD-10-CM

## 2015-02-22 DIAGNOSIS — B373 Candidiasis of vulva and vagina: Secondary | ICD-10-CM

## 2015-02-22 MED ORDER — FLUCONAZOLE 100 MG PO TABS: 100.0000 mg | ORAL_TABLET | Freq: Once | ORAL | Status: DC

## 2015-02-22 MED ORDER — TERCONAZOLE 0.4 % VA CREA: 1.0000 | TOPICAL_CREAM | Freq: Every day | VAGINAL | Status: DC

## 2015-02-22 MED ORDER — METRONIDAZOLE 500 MG PO TABS: 500.0000 mg | ORAL_TABLET | Freq: Two times a day (BID) | ORAL | Status: DC

## 2015-03-07 ENCOUNTER — Other Ambulatory Visit (INDEPENDENT_AMBULATORY_CARE_PROVIDER_SITE_OTHER): Payer: 59

## 2015-03-07 ENCOUNTER — Ambulatory Visit (INDEPENDENT_AMBULATORY_CARE_PROVIDER_SITE_OTHER): Payer: 59 | Admitting: Physician Assistant

## 2015-03-07 ENCOUNTER — Encounter: Payer: Self-pay | Admitting: Physician Assistant

## 2015-03-07 VITALS — BP 104/70 | HR 76 | Ht 66.0 in | Wt 121.4 lb

## 2015-03-07 DIAGNOSIS — K5909 Other constipation: Secondary | ICD-10-CM

## 2015-03-07 DIAGNOSIS — R1031 Right lower quadrant pain: Secondary | ICD-10-CM

## 2015-03-07 LAB — CBC WITH DIFFERENTIAL/PLATELET
Basophils Absolute: 0 10*3/uL (ref 0.0–0.1)
Basophils Relative: 0.4 % (ref 0.0–3.0)
EOS ABS: 0.2 10*3/uL (ref 0.0–0.7)
Eosinophils Relative: 3.3 % (ref 0.0–5.0)
HCT: 41.2 % (ref 36.0–46.0)
Hemoglobin: 14.2 g/dL (ref 12.0–15.0)
LYMPHS ABS: 1.5 10*3/uL (ref 0.7–4.0)
LYMPHS PCT: 22 % (ref 12.0–46.0)
MCHC: 34.4 g/dL (ref 30.0–36.0)
MCV: 92.4 fl (ref 78.0–100.0)
MONO ABS: 0.5 10*3/uL (ref 0.1–1.0)
Monocytes Relative: 7.3 % (ref 3.0–12.0)
NEUTROS ABS: 4.6 10*3/uL (ref 1.4–7.7)
NEUTROS PCT: 67 % (ref 43.0–77.0)
PLATELETS: 355 10*3/uL (ref 150.0–400.0)
RBC: 4.46 Mil/uL (ref 3.87–5.11)
RDW: 11.5 % (ref 11.5–15.5)
WBC: 6.9 10*3/uL (ref 4.0–10.5)

## 2015-03-07 LAB — IGA: IgA: 314 mg/dL (ref 68–378)

## 2015-03-07 LAB — TSH: TSH: 1.43 u[IU]/mL (ref 0.35–4.50)

## 2015-03-07 LAB — SEDIMENTATION RATE: SED RATE: 24 mm/h — AB (ref 0–22)

## 2015-03-07 LAB — HIGH SENSITIVITY CRP: CRP HIGH SENSITIVITY: 0.95 mg/L (ref 0.000–5.000)

## 2015-03-07 NOTE — Patient Instructions (Signed)
Please go to the basement level to have your labs drawn.  Push your water intake, 5-6 glasses of water daily at least.   Take Benefiber daily. You can put a serving of it in a glass of water. You can get Benefiber at any pharmacy, [facility], [facility] and [facility]. Use Miralax, 17 grams in 8 oz of water every 3-4 days if no bowel movement. Follow up with Dr. Marina GoodellPerry or Amy Esterwood PA-C.

## 2015-03-07 NOTE — Progress Notes (Signed)
Agree with initial assessment and plans as outlined 

## 2015-03-07 NOTE — Progress Notes (Signed)
Patient ID: Wendy Mccarty, female   DOB: 07-19-1990, 25 y.o.   MRN: [id number]   Subjective:    Patient ID: Wendy Mccarty, female    DOB: 1990/09/19, 25 y.o.   MRN: [id number]  HPI  Wendy Mccarty  Is Mccarty pleasant 25 year old African-American female referred today by  Wendy Mccarty CNM for evaluation of constipation and abdominal pain. Patient states she has had some problems with constipation over the past year or so which seemed to have worsened at the past 3-4 months. She may go 4-5 days between bowel movements says sometimes stools go back and forth between hard and loose stools. She has occasional straining but not daily and abdominal bloating. She's not noticed any melena or hematochezia she says intermittently she may get Mccarty sharp pain in her right lower quadrant only when straining for Mccarty bowel movement. Her appetite has been fine, her weight has been stable.  She was started on MiraLAX but says she hasn't actually taken it yet and usually will take an over-the-counter "women's laxative" as needed.   Family history is negative for GI disease  Review of Systems  Pertinent positive and negative review of systems were noted in the above HPI section.  All other review of systems was otherwise negative.  Outpatient Encounter Prescriptions as of 03/07/2015  Medication Sig  . fluconazole (DIFLUCAN) 100 MG tablet Take 1 tablet (100 mg total) by mouth once. Repeat dose in 48-72 hour.  . Norethindrone-Ethinyl Estradiol-Fe Biphas (LO LOESTRIN FE) 1 MG-10 MCG / 10 MCG tablet Take 1 tablet by mouth daily.  . polyethylene glycol powder (GLYCOLAX/MIRALAX) powder Take 17 g by mouth daily.  Marland Kitchen terconazole (TERAZOL 7) 0.4 % vaginal cream Place 1 applicator vaginally at bedtime.  . valACYclovir (VALTREX) 500 MG tablet Take 1 tablet (500 mg total) by mouth daily.  . [DISCONTINUED] metroNIDAZOLE (FLAGYL) 500 MG tablet Take 1 tablet (500 mg total) by mouth 2 (two) times daily.   No facility-administered encounter  medications on file as of 03/07/2015.   No Known Allergies Patient Active Problem List   Diagnosis Date Noted  . Constipation 02/15/2015  . BV (bacterial vaginosis) 04/21/2014  . Chlamydia infection 04/30/2013  . 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 Marital Status: Single    Spouse Name: N/Mccarty  . Number of Children: N/Mccarty  . Years of Education: N/Mccarty   Occupational History  . Not on file.   Social History Main Topics  . Smoking status: Never Smoker   . Smokeless tobacco: Never Used  . Alcohol Use: 0.0 oz/week    0 Standard drinks or equivalent per week     Comment: occasionally   . Drug Use: No  . Sexual Activity:    Partners: Male    Birth Control/ Protection: Pill   Other Topics Concern  . Not on file   Social History Narrative    Ms. Flaten's family history includes Hypertension in her maternal grandfather and mother. There is no history of Colon cancer, Esophageal cancer, Pancreatic cancer, Liver disease, Kidney disease, Diabetes, or Heart disease.      Objective:    Filed Vitals:   03/07/15 0935  BP: 104/70  Pulse: 76    Physical Exam   Well-developed young African-American female in no acute distress, pleasant blood pressure 104/70 pulse 76 height 5 foot 6 weight 121. HEENT; nontraumatic normocephalic EOMI PERRLA sclera anicteric , Cardiovascular; regular rate and rhythm with S1-S2 no murmur or gallop,  Pulmonary; clear bilaterally , Abdomen ;soft nontender, nondistended, bowel sounds are active there is no palpable mass or hepatosplenomegaly , Rectal ;exam not done, Ext; no clubbing cyanosis or edema skin warm and dry, Neuropsych; mood and affect appropriate     Assessment & Plan:   #1  25 yo female with chronic constipation,intermittent right LQ pain with BM Suspect slow transit constipation, r/o IBD  Plan; Discussed Gen. Management of constipation with high-fiber diet, fiber supplementation and liberal water intake as  well as exercise.   patient will add Benefiber 1 dose daily and Mccarty large glass of water , and use MiraLAX every 3-4 days on Mccarty when necessary basis if no bowel movement  Check CBC with differential, sedimentation rate, CRP, TTG , IgA, and TSH  Patient will be established with Dr. Marina GoodellPerry , follow-up will be as needed   Manfred Laspina Oswald HillockS Dorna Mallet PA-C 03/07/2015   Cc: Wendy Coombsenney, Wendy Mccarty, CNM

## 2015-03-08 LAB — TISSUE TRANSGLUTAMINASE, IGG: TISSUE TRANSGLUT AB: 1 U/mL (ref <6)

## 2015-09-27 ENCOUNTER — Emergency Department (HOSPITAL_COMMUNITY): Admission: EM | Admit: 2015-09-27 | Discharge: 2015-09-27 | Discharge status: Against Medical Advice | Payer: 59

## 2015-10-19 ENCOUNTER — Other Ambulatory Visit: Payer: Self-pay | Admitting: Obstetrics

## 2015-11-20 ENCOUNTER — Telehealth: Payer: Self-pay | Admitting: *Deleted

## 2015-11-20 NOTE — Telephone Encounter (Signed)
Pt called to office stating that she needs BC refill.  Pt was last seen by Boykin Reaperachelle, was given LoLoestrin.  Pt states she did not fill Rx due to cost.  Pt states that [facility]. Pt is now having problems in getting BC from them. Pt would like to know if [facility] can [facility] [address] Pt states that she would like low cost Rx.  Please advise on Rx change.

## 2015-11-21 ENCOUNTER — Other Ambulatory Visit: Payer: Self-pay | Admitting: Certified Nurse Midwife

## 2015-11-21 DIAGNOSIS — Z30011 Encounter for initial prescription of contraceptive pills: Secondary | ICD-10-CM

## 2015-11-21 MED ORDER — NORGESTIMATE-ETH ESTRADIOL 0.25-35 MG-MCG PO TABS: 1.0000 | ORAL_TABLET | Freq: Every day | ORAL | 11 refills | Status: DC

## 2015-11-21 NOTE — Telephone Encounter (Signed)
Attempt to contact pt. LM on VM making her aware that Boykin ReaperRachelle has sent Rx to pharmacy. Advised to call office if needed.

## 2015-11-21 NOTE — Telephone Encounter (Signed)
Please let her know that I have [facility].  It is on the 9.00/list.  Thank you.  R.Atiyana Welte CNM

## 2015-11-22 ENCOUNTER — Telehealth: Payer: Self-pay | Admitting: *Deleted

## 2015-11-22 NOTE — Telephone Encounter (Signed)
Pharmacy called for patient states patient does not know why her BCP was changed to [facility].Patient wants to continue with [facility] for insurance purpose and this BCP works well for her.[place]le Denney states ok for patient to continue the [facility].

## 2015-12-19 ENCOUNTER — Ambulatory Visit: Payer: 59 | Admitting: Certified Nurse Midwife

## 2016-01-03 ENCOUNTER — Encounter: Payer: Self-pay | Admitting: Certified Nurse Midwife

## 2016-01-03 ENCOUNTER — Ambulatory Visit (INDEPENDENT_AMBULATORY_CARE_PROVIDER_SITE_OTHER): Payer: 59 | Admitting: Certified Nurse Midwife

## 2016-01-03 VITALS — BP 108/69 | HR 83 | Wt 118.0 lb

## 2016-01-03 DIAGNOSIS — N946 Dysmenorrhea, unspecified: Secondary | ICD-10-CM | POA: Diagnosis not present

## 2016-01-03 DIAGNOSIS — R399 Unspecified symptoms and signs involving the genitourinary system: Secondary | ICD-10-CM

## 2016-01-03 DIAGNOSIS — N3001 Acute cystitis with hematuria: Secondary | ICD-10-CM

## 2016-01-03 DIAGNOSIS — Z113 Encounter for screening for infections with a predominantly sexual mode of transmission: Secondary | ICD-10-CM

## 2016-01-03 LAB — POCT URINALYSIS DIPSTICK
BILIRUBIN UA: NEGATIVE
Glucose, UA: NEGATIVE
KETONES UA: NEGATIVE
Leukocytes, UA: NEGATIVE
Nitrite, UA: NEGATIVE
PH UA: 7
PROTEIN UA: NEGATIVE
SPEC GRAV UA: 1.01
Urobilinogen, UA: NEGATIVE

## 2016-01-03 MED ORDER — CYCLOBENZAPRINE HCL 10 MG PO TABS: 10.0000 mg | ORAL_TABLET | Freq: Three times a day (TID) | ORAL | 1 refills | Status: DC | PRN

## 2016-01-03 MED ORDER — PHENAZOPYRIDINE HCL 200 MG PO TABS: 200.0000 mg | ORAL_TABLET | Freq: Three times a day (TID) | ORAL | 0 refills | Status: DC | PRN

## 2016-01-03 MED ORDER — NITROFURANTOIN MONOHYD MACRO 100 MG PO CAPS: 100.0000 mg | ORAL_CAPSULE | Freq: Two times a day (BID) | ORAL | 0 refills | Status: DC

## 2016-01-03 NOTE — Progress Notes (Signed)
?   Uti, would like std check today.  Pt states she is having some pelvic pressure when urinating. Pt states she has shooting pains in rectal area when she is on cycle.

## 2016-01-03 NOTE — Progress Notes (Signed)
Patient ID: Wendy Mccarty, female   DOB: [birth year], 25 y.o.   MRN: [id number]  Chief Complaint  Patient presents with  . Gynecologic Exam    HPI Wendy Mccarty is a 25 y.o. female.  Is here with UTI symptoms for several days, reports urgency and frequency.  Denies any new partners.  Is currently sexually active.  Desires STD screening.  Reports severe cramping with menses that are in her back.  Denies any current constipation.  Has been to gastroenterology.  States that motrin does help with dysmenorrhea symptoms.     HPI  Past Medical History:  Diagnosis Date  . Anal fissure   . History of blood transfusion     Past Surgical History:  Procedure Laterality Date  . CARPAL TUNNEL RELEASE Left 04/15/2012   Procedure: OPEN CARPAL TUNNEL RELEASE WITH REPAIR RECONSTRUCTION AS NECESSARY ;  Surgeon: Dominica SeverinWilliam Gramig, MD;  Location: [facility];  Service: Orthopedics;  Laterality: Left;  . OPEN REDUCTION INTERNAL FIXATION (ORIF) DISTAL RADIAL FRACTURE Left 04/15/2012   Procedure: OPEN REDUCTION INTERNAL FIXATION (ORIF) LEFT DISTAL RADIUS FRACTURE;  Surgeon: Dominica SeverinWilliam Gramig, MD;  Location: [facility];  Service: Orthopedics;  Laterality: Left;  . TOOTH EXTRACTION      Family History  Problem Relation Age of Onset  . Hypertension Mother   . Hypertension Maternal Grandfather   . Colon cancer Neg Hx   . Esophageal cancer Neg Hx   . Pancreatic cancer Neg Hx   . Liver disease Neg Hx   . Kidney disease Neg Hx   . Diabetes Neg Hx   . Heart disease Neg Hx     Social History Social History  Substance Use Topics  . Smoking status: Never Smoker  . Smokeless tobacco: Never Used  . Alcohol use 0.0 oz/week     Comment: occasionally     No Known Allergies  Current Outpatient Prescriptions  Medication Sig Dispense Refill  . norgestimate-ethinyl estradiol (ORTHO-CYCLEN,SPRINTEC,PREVIFEM) 0.25-35 MG-MCG tablet Take 1 tablet by mouth daily. 1 Package 11  . cyclobenzaprine (FLEXERIL) 10 MG tablet Take 1  tablet (10 mg total) by mouth every 8 (eight) hours as needed for muscle spasms. 30 tablet 1  . nitrofurantoin, macrocrystal-monohydrate, (MACROBID) 100 MG capsule Take 1 capsule (100 mg total) by mouth 2 (two) times daily. 14 capsule 0  . phenazopyridine (PYRIDIUM) 200 MG tablet Take 1 tablet (200 mg total) by mouth 3 (three) times daily as needed for pain. 30 tablet 0  . valACYclovir (VALTREX) 500 MG tablet TAKE ONE TABLET BY MOUTH ONCE DAILY (Patient not taking: Reported on 01/03/2016) 30 tablet 6   No current facility-administered medications for this visit.     Review of Systems Review of Systems Constitutional: negative for fatigue and weight loss Respiratory: negative for cough and wheezing Cardiovascular: negative for chest pain, fatigue and palpitations Gastrointestinal: negative for abdominal pain and change in bowel habits Genitourinary: + frequency, urgency, dysmenorrhea Integument/breast: negative for nipple discharge Musculoskeletal:negative for myalgias Neurological: negative for gait problems and tremors Behavioral/Psych: negative for abusive relationship, depression Endocrine: negative for temperature intolerance      Blood pressure 108/69, pulse 83, weight 118 lb (53.5 kg), last menstrual period 12/19/2015.  Physical Exam Physical Exam General:   alert  Skin:   no rash or abnormalities  Lungs:   clear to auscultation bilaterally  Heart:   regular rate and rhythm, S1, S2 normal, no murmur, click, rub or gallop  Breasts:   deferred  Abdomen:  normal findings: no organomegaly,  soft, non-tender and no hernia  Pelvis:  External genitalia: normal general appearance Urinary system: urethral meatus normal and bladder without fullness, nontender Vaginal: normal without tenderness, induration or masses     50% of 15 min visit spent on counseling and coordination of care.    Data Reviewed Previous medical hx, meds, labs  Assessment     Hematuria Dysmenorrhea H/O  constipation    Plan   Schedule Annual exam ASAP  Orders Placed This Encounter  Procedures  . Urine culture  . POCT urinalysis dipstick   Meds ordered this encounter  Medications  . cyclobenzaprine (FLEXERIL) 10 MG tablet    Sig: Take 1 tablet (10 mg total) by mouth every 8 (eight) hours as needed for muscle spasms.    Dispense:  30 tablet    Refill:  1  . nitrofurantoin, macrocrystal-monohydrate, (MACROBID) 100 MG capsule    Sig: Take 1 capsule (100 mg total) by mouth 2 (two) times daily.    Dispense:  14 capsule    Refill:  0  . phenazopyridine (PYRIDIUM) 200 MG tablet    Sig: Take 1 tablet (200 mg total) by mouth 3 (three) times daily as needed for pain.    Dispense:  30 tablet    Refill:  0    Need to obtain previous records Possible management options include: urology consult Follow up as needed.

## 2016-01-05 LAB — URINE CULTURE

## 2016-01-06 LAB — NUSWAB VG+, CANDIDA 6SP
CANDIDA ALBICANS, NAA: POSITIVE — AB
CANDIDA LUSITANIAE, NAA: NEGATIVE
CANDIDA PARAPSILOSIS, NAA: NEGATIVE
CHLAMYDIA TRACHOMATIS, NAA: NEGATIVE
Candida glabrata, NAA: NEGATIVE
Candida krusei, NAA: NEGATIVE
Candida tropicalis, NAA: NEGATIVE
Neisseria gonorrhoeae, NAA: NEGATIVE
TRICH VAG BY NAA: NEGATIVE

## 2016-01-07 ENCOUNTER — Other Ambulatory Visit: Payer: Self-pay | Admitting: Certified Nurse Midwife

## 2016-01-07 DIAGNOSIS — B373 Candidiasis of vulva and vagina: Secondary | ICD-10-CM

## 2016-01-07 DIAGNOSIS — B3731 Acute candidiasis of vulva and vagina: Secondary | ICD-10-CM

## 2016-01-07 MED ORDER — FLUCONAZOLE 200 MG PO TABS: 200.0000 mg | ORAL_TABLET | Freq: Once | ORAL | 0 refills | Status: AC

## 2016-01-07 MED ORDER — TERCONAZOLE 0.8 % VA CREA: 1.0000 | TOPICAL_CREAM | Freq: Every day | VAGINAL | 0 refills | Status: DC

## 2016-01-24 ENCOUNTER — Ambulatory Visit: Payer: 59 | Admitting: Certified Nurse Midwife

## 2016-03-24 ENCOUNTER — Telehealth: Payer: Self-pay | Admitting: *Deleted

## 2016-03-24 DIAGNOSIS — N760 Acute vaginitis: Principal | ICD-10-CM

## 2016-03-24 DIAGNOSIS — B9689 Other specified bacterial agents as the cause of diseases classified elsewhere: Secondary | ICD-10-CM

## 2016-03-24 MED ORDER — METRONIDAZOLE 0.75 % VA GEL: 1.0000 | Freq: Every day | VAGINAL | 1 refills | Status: DC

## 2016-03-24 NOTE — Telephone Encounter (Signed)
Sent Metrogel to pharmacy per pt req

## 2016-10-20 ENCOUNTER — Telehealth: Payer: Self-pay

## 2016-10-20 NOTE — Telephone Encounter (Signed)
Returned call, pt wanted to know date of last pap, no answer, left vm.

## 2016-10-24 ENCOUNTER — Other Ambulatory Visit: Payer: Self-pay | Admitting: Certified Nurse Midwife

## 2016-10-31 ENCOUNTER — Other Ambulatory Visit: Payer: Self-pay | Admitting: Certified Nurse Midwife

## 2016-11-03 ENCOUNTER — Telehealth: Payer: Self-pay | Admitting: *Deleted

## 2016-11-03 NOTE — Telephone Encounter (Signed)
Pt called to office asking for refill on BC pills.  Call returned to pt. Pt made aware that Dr Clearance CootsHarper sent Rx to pharmacy on 11/01/16. Pt advised to call office if any problems getting Rx.

## 2019-09-24 ENCOUNTER — Encounter (HOSPITAL_COMMUNITY): Payer: Self-pay

## 2019-09-24 ENCOUNTER — Ambulatory Visit (HOSPITAL_COMMUNITY)
Admission: EM | Admit: 2019-09-24 | Discharge: 2019-09-24 | Disposition: A | Discharge status: Home / Self Care | Payer: 59 | Attending: Urgent Care | Admitting: Urgent Care

## 2019-09-24 ENCOUNTER — Other Ambulatory Visit: Payer: Self-pay

## 2019-09-24 DIAGNOSIS — U071 COVID-19: Secondary | ICD-10-CM | POA: Diagnosis not present

## 2019-09-24 DIAGNOSIS — Z20822 Contact with and (suspected) exposure to covid-19: Secondary | ICD-10-CM

## 2019-09-24 DIAGNOSIS — R05 Cough: Secondary | ICD-10-CM | POA: Diagnosis present

## 2019-09-24 DIAGNOSIS — R52 Pain, unspecified: Secondary | ICD-10-CM

## 2019-09-24 DIAGNOSIS — R0981 Nasal congestion: Secondary | ICD-10-CM

## 2019-09-24 DIAGNOSIS — R059 Cough, unspecified: Secondary | ICD-10-CM

## 2019-09-24 MED ORDER — CETIRIZINE HCL 10 MG PO TABS: 10.0000 mg | ORAL_TABLET | Freq: Every day | ORAL | 0 refills | Status: DC

## 2019-09-24 MED ORDER — PROMETHAZINE-DM 6.25-15 MG/5ML PO SYRP
5.0000 mL | ORAL_SOLUTION | Freq: Every evening | ORAL | 0 refills | Status: DC | PRN
Start: 2019-09-24 — End: 2020-07-04

## 2019-09-24 MED ORDER — PSEUDOEPHEDRINE HCL 30 MG PO TABS
30.0000 mg | ORAL_TABLET | Freq: Three times a day (TID) | ORAL | 0 refills | Status: DC | PRN
Start: 2019-09-24 — End: 2020-07-04

## 2019-09-24 MED ORDER — BENZONATATE 100 MG PO CAPS: 100.0000 mg | ORAL_CAPSULE | Freq: Three times a day (TID) | ORAL | 0 refills | Status: DC | PRN

## 2019-09-24 NOTE — ED Provider Notes (Signed)
[facility]   MRN: [id number] DOB: 1991/01/23  Subjective:   Wendy Mccarty is a 29 y.o. female presenting for 10-day history of malaise that started out with body aches and progressed to sinus congestion, cough, throat pain.  Patient states that the cough is lingering and persistent.  She did travel to [place] and this is where she got her Covid 19 infection, states that all of her family members and her boyfriend ended up testing positive.  She tested negative on 09/15/2019 and a retest on 09/21/2019.  The first was negative the second was positive.  Takes OCP only.  No Known Allergies  Past Medical History:  Diagnosis Date   Anal fissure    History of blood transfusion      Past Surgical History:  Procedure Laterality Date   CARPAL TUNNEL RELEASE Left 04/15/2012   Procedure: OPEN CARPAL TUNNEL RELEASE WITH REPAIR RECONSTRUCTION AS NECESSARY ;  Surgeon: Dominica Severin, MD;  [facility];  Service: Orthopedics;  Laterality: Left;   OPEN REDUCTION INTERNAL FIXATION (ORIF) DISTAL RADIAL FRACTURE Left 04/15/2012   Procedure: OPEN REDUCTION INTERNAL FIXATION (ORIF) LEFT DISTAL RADIUS FRACTURE;  Surgeon: Dominica Severin, MD;  Location: [facility];  Service: Orthopedics;  Laterality: Left;   TOOTH EXTRACTION      Family History  Problem Relation Age of Onset   Hypertension Mother    Hypertension Maternal Grandfather    Colon cancer Neg Hx    Esophageal cancer Neg Hx    Pancreatic cancer Neg Hx    Liver disease Neg Hx    Kidney disease Neg Hx    Diabetes Neg Hx    Heart disease Neg Hx     Social History   Tobacco Use   Smoking status: Never Smoker   Smokeless tobacco: Never Used  Substance Use Topics   Alcohol use: Yes    Alcohol/week: 0.0 standard drinks    Comment: occasionally    Drug use: No    ROS   Objective:   Vitals: BP 116/70    Pulse 85    Temp 98.1 F (36.7 C) (Oral)    Resp 16    Ht 5\' 5"  (1.651 m)    Wt 120 lb (54.4 kg)    SpO2  100%    BMI 19.97 kg/m   Physical Exam Constitutional:      General: She is not in acute distress.    Appearance: Normal appearance. She is well-developed. She is not ill-appearing, toxic-appearing or diaphoretic.  HENT:     Head: Normocephalic and atraumatic.     Right Ear: Tympanic membrane and ear canal normal. No drainage or tenderness. No middle ear effusion. Tympanic membrane is not erythematous.     Left Ear: Tympanic membrane and ear canal normal. No drainage or tenderness.  No middle ear effusion. Tympanic membrane is not erythematous.     Nose: Nose normal. No congestion or rhinorrhea.     Mouth/Throat:     Mouth: Mucous membranes are moist. No oral lesions.     Pharynx: Oropharynx is clear. No pharyngeal swelling, oropharyngeal exudate, posterior oropharyngeal erythema or uvula swelling.     Tonsils: No tonsillar exudate or tonsillar abscesses.  Eyes:     General: No scleral icterus.       Right eye: No discharge.        Left eye: No discharge.     Extraocular Movements: Extraocular movements intact.     Right eye: Normal extraocular motion.  Left eye: Normal extraocular motion.     Conjunctiva/sclera: Conjunctivae normal.     Pupils: Pupils are equal, round, and reactive to light.  Cardiovascular:     Rate and Rhythm: Normal rate and regular rhythm.     Pulses: Normal pulses.     Heart sounds: Normal heart sounds. No murmur heard.  No friction rub. No gallop.   Pulmonary:     Effort: Pulmonary effort is normal. No respiratory distress.     Breath sounds: Normal breath sounds. No stridor. No wheezing, rhonchi or rales.  Musculoskeletal:     Cervical back: Normal range of motion and neck supple.  Lymphadenopathy:     Cervical: No cervical adenopathy.  Skin:    General: Skin is warm and dry.     Findings: No rash.  Neurological:     General: No focal deficit present.     Mental Status: She is alert and oriented to person, place, and time.  Psychiatric:         Mood and Affect: Mood normal.        Behavior: Behavior normal.        Thought Content: Thought content normal.      Assessment and Plan :   PDMP not reviewed this encounter.  1. Clinical diagnosis of COVID-19   2. Exposure to COVID-19 virus   3. Cough   4. Body aches   5. Sinus congestion     Will manage for clinical diagnosis of COVID-19 with supportive care. Counseled patient on nature of COVID-19 including modes of transmission, diagnostic testing, management and supportive care.  Offered symptomatic relief.  No alarming physical exam findings, vital signs very stable for outpatient management.  Counseled patient on potential for adverse effects with medications prescribed/recommended today, strict ER and return-to-clinic precautions discussed, patient verbalized understanding.     Wallis Bamberg, PA-C 09/24/19 1100

## 2019-09-24 NOTE — Discharge Instructions (Addendum)

## 2019-09-24 NOTE — ED Triage Notes (Addendum)
Pt states she was around a COVID + persona dn she took a home COVID test and it came out +. Pt c/o productive cough w/yellowish/ greenish mucous, nasal congestionx11 days. Pt c/o dyspnea w/exertion. Pt also states has loss of taste/smell.

## 2019-09-25 LAB — SARS CORONAVIRUS 2 (TAT 6-24 HRS): SARS Coronavirus 2: POSITIVE — AB

## 2020-01-24 ENCOUNTER — Other Ambulatory Visit: Payer: Self-pay

## 2020-01-24 ENCOUNTER — Encounter (HOSPITAL_COMMUNITY): Payer: Self-pay

## 2020-01-24 ENCOUNTER — Ambulatory Visit (HOSPITAL_COMMUNITY)
Admission: EM | Admit: 2020-01-24 | Discharge: 2020-01-24 | Disposition: A | Discharge status: Home / Self Care | Payer: 59 | Attending: Family Medicine | Admitting: Family Medicine

## 2020-01-24 DIAGNOSIS — R0789 Other chest pain: Secondary | ICD-10-CM

## 2020-01-24 DIAGNOSIS — M5410 Radiculopathy, site unspecified: Secondary | ICD-10-CM

## 2020-01-24 MED ORDER — TIZANIDINE HCL 4 MG PO TABS
4.0000 mg | ORAL_TABLET | Freq: Four times a day (QID) | ORAL | 0 refills | Status: DC | PRN
Start: 2020-01-24 — End: 2020-10-11

## 2020-01-24 MED ORDER — PREDNISONE 10 MG (21) PO TBPK
ORAL_TABLET | ORAL | 0 refills | Status: DC
Start: 2020-01-24 — End: 2020-07-04

## 2020-01-24 NOTE — ED Triage Notes (Signed)
Pt reports intermittent left sided chest pain, numbness left arm x 6 days. States having dull and tightness chest pain.Reports she went to the ED 5 days ago and was told blood pressure was high  Denies blurry vision, headache, dizziness, nausea, abdominal pain

## 2020-01-24 NOTE — ED Provider Notes (Signed)
[facility]    CSN: [id number] Arrival date & time: 01/24/20  0840      History   Chief Complaint Chief Complaint  Patient presents with   Chest Pain   Numbness    HPI Wendy Mccarty is a 29 y.o. female.   Patient is a 29 year old female presents today for left-sided chest pain, left upper back pain.  This is been present for the past 6 days.  The problem is waxing waning.  She has some radiation of pain down the left with associated numbness and tingling at times.  Denies any preceding injuries or heavy lifting.  No blurry vision, headache, dizziness, nausea, diaphoresis or abdominal pain.  No cough or shortness of breath. No fever. Was seen in an ER back home a few days ago with normal EKG. Work at home on a computer.     Past Medical History:  Diagnosis Date   Anal fissure    History of blood transfusion     Patient Active Problem List   Diagnosis Date Noted   Constipation 02/15/2015   BV (bacterial vaginosis) 04/21/2014   Chlamydia infection 04/30/2013   Genital herpes in women 04/30/2013    Past Surgical History:  Procedure Laterality Date   CARPAL TUNNEL RELEASE Left 04/15/2012   Procedure: OPEN CARPAL TUNNEL RELEASE WITH REPAIR RECONSTRUCTION AS NECESSARY ;  Surgeon: Dominica Severin, MD;  [facility];  Service: Orthopedics;  Laterality: Left;   OPEN REDUCTION INTERNAL FIXATION (ORIF) DISTAL RADIAL FRACTURE Left 04/15/2012   Procedure: OPEN REDUCTION INTERNAL FIXATION (ORIF) LEFT DISTAL RADIUS FRACTURE;  Surgeon: Dominica Severin, MD;  [facility];  Service: Orthopedics;  Laterality: Left;   TOOTH EXTRACTION      OB History    Gravida  1   Para      Term      Preterm      AB  1   Living        SAB      TAB  1   Ectopic      Multiple      Live Births               Home Medications    Prior to Admission medications   Medication Sig Start Date End Date Taking? Authorizing Provider  benzonatate (TESSALON)  100 MG capsule Take 1-2 capsules (100-200 mg total) by mouth 3 (three) times daily as needed. 09/24/19   Wallis Bamberg, PA-C  cetirizine (ZYRTEC ALLERGY) 10 MG tablet Take 1 tablet (10 mg total) by mouth daily. 09/24/19   Wallis Bamberg, PA-C  norgestimate-ethinyl estradiol (ORTHO-CYCLEN,SPRINTEC,PREVIFEM) 0.25-35 MG-MCG tablet Take 1 tablet by mouth daily. 11/21/15   Orvilla Cornwall A, CNM  predniSONE (STERAPRED UNI-PAK 21 TAB) 10 MG (21) TBPK tablet 6 tabs for 1 day, then 5 tabs for 1 das, then 4 tabs for 1 day, then 3 tabs for 1 day, 2 tabs for 1 day, then 1 tab for 1 day 01/24/20   Dahlia Byes A, NP  promethazine-dextromethorphan (PROMETHAZINE-DM) 6.25-15 MG/5ML syrup Take 5 mLs by mouth at bedtime as needed for cough. 09/24/19   Wallis Bamberg, PA-C  pseudoephedrine (SUDAFED) 30 MG tablet Take 1 tablet (30 mg total) by mouth every 8 (eight) hours as needed for congestion. 09/24/19   Wallis Bamberg, PA-C  tiZANidine (ZANAFLEX) 4 MG tablet Take 1 tablet (4 mg total) by mouth every 6 (six) hours as needed for muscle spasms. 01/24/20   Janace Aris, NP  valACYclovir (  VALTREX) 500 MG tablet TAKE ONE TABLET BY MOUTH ONCE DAILY Patient not taking: Reported on 01/03/2016 10/19/15   Brock Bad, MD  [facility] 0.1-20 MG-MCG tablet TAKE ONE TABLET BY MOUTH ONCE DAILY **DISCONTINUE SPRINTEC** 11/01/16 09/24/19  Brock Bad, MD    Family History Family History  Problem Relation Age of Onset   Hypertension Mother    Hypertension Maternal Grandfather    Colon cancer Neg Hx    Esophageal cancer Neg Hx    Pancreatic cancer Neg Hx    Liver disease Neg Hx    Kidney disease Neg Hx    Diabetes Neg Hx    Heart disease Neg Hx     Social History Social History   Tobacco Use   Smoking status: Never Smoker   Smokeless tobacco: Never Used  Substance Use Topics   Alcohol use: Yes    Alcohol/week: 0.0 standard drinks    Comment: occasionally    Drug use: No     Allergies   Patient has no known  allergies.   Review of Systems Review of Systems   Physical Exam Triage Vital Signs ED Triage Vitals  Enc Vitals Group     BP 01/24/20 0856 131/77     Pulse Rate 01/24/20 0856 88     Resp 01/24/20 0856 16     Temp 01/24/20 0856 98 F (36.7 C)     Temp Source 01/24/20 0856 Oral     SpO2 01/24/20 0856 100 %     Weight --      Height --      Head Circumference --      Peak Flow --      Pain Score 01/24/20 0857 1     Pain Loc --      Pain Edu? --      Excl. in GC? --    No data found.  Updated Vital Signs BP 131/77 (BP Location: Right Arm)    Pulse 88    Temp 98 F (36.7 C) (Oral)    Resp 16    SpO2 100%   Visual Acuity Right Eye Distance:   Left Eye Distance:   Bilateral Distance:    Right Eye Near:   Left Eye Near:    Bilateral Near:     Physical Exam Vitals and nursing note reviewed.  Constitutional:      General: She is not in acute distress.    Appearance: Normal appearance. She is not ill-appearing, toxic-appearing or diaphoretic.  HENT:     Head: Normocephalic.     Nose: Nose normal.     Mouth/Throat:     Pharynx: Oropharynx is clear.  Eyes:     Conjunctiva/sclera: Conjunctivae normal.  Cardiovascular:     Rate and Rhythm: Normal rate and regular rhythm.  Pulmonary:     Effort: Pulmonary effort is normal.     Breath sounds: Normal breath sounds.  Musculoskeletal:        General: Normal range of motion.     Cervical back: Normal range of motion. Tenderness present.       Back:     Comments: Normal ROM of the left arm.  No swelling 2+ radial pulse Normal temperature and color of the arm  Skin:    General: Skin is warm and dry.     Findings: No rash.  Neurological:     Mental Status: She is alert.  Psychiatric:        Mood and Affect: Mood normal.  UC Treatments / Results  Labs (all labs ordered are listed, but only abnormal results are displayed) Labs Reviewed - No data to display  EKG   Radiology No results  found.  Procedures Procedures (including critical care time)  Medications Ordered in UC Medications - No data to display  Initial Impression / Assessment and Plan / UC Course  I have reviewed the triage vital signs and the nursing notes.  Pertinent labs & imaging results that were available during my care of the patient were reviewed by me and considered in my medical decision making (see chart for details).     Chest wall pain with associated radiculopathy.  EKG with NSR and normal rate. No concern for ACS Lungs clear.  Will treat with exercises.  Prednisone for pain, nerve inflammation.  Zanaflex for muscle relaxation as needed.  Stretches, alternate heat and ice.  Follow up as needed for continued or worsening symptoms  Final Clinical Impressions(s) / UC Diagnoses   Final diagnoses:  Chest wall pain  Radiculopathy, unspecified spinal region     Discharge Instructions     Prednisone daily for 6 days with food. Zanaflex for muscle relaxation as needed. Gentle stretching, massage, heat  I have placed some exercises in the discharge instructions.  Follow up as needed for continued or worsening symptoms    ED Prescriptions    Medication Sig Dispense Auth. Provider   predniSONE (STERAPRED UNI-PAK 21 TAB) 10 MG (21) TBPK tablet 6 tabs for 1 day, then 5 tabs for 1 das, then 4 tabs for 1 day, then 3 tabs for 1 day, 2 tabs for 1 day, then 1 tab for 1 day 21 tablet Devika Dragovich A, NP   tiZANidine (ZANAFLEX) 4 MG tablet Take 1 tablet (4 mg total) by mouth every 6 (six) hours as needed for muscle spasms. 30 tablet Dahlia Byes A, NP     PDMP not reviewed this encounter.   Dahlia Byes A, NP 01/24/20 1020

## 2020-01-24 NOTE — Discharge Instructions (Addendum)
Prednisone daily for 6 days with food. Zanaflex for muscle relaxation as needed. Gentle stretching, massage, heat  I have placed some exercises in the discharge instructions.  Follow up as needed for continued or worsening symptoms

## 2020-03-06 DIAGNOSIS — R87619 Unspecified abnormal cytological findings in specimens from cervix uteri: Secondary | ICD-10-CM | POA: Insufficient documentation

## 2020-07-05 ENCOUNTER — Ambulatory Visit (INDEPENDENT_AMBULATORY_CARE_PROVIDER_SITE_OTHER): Payer: 59 | Admitting: Orthopedic Surgery

## 2020-07-05 ENCOUNTER — Encounter: Payer: Self-pay | Admitting: Orthopedic Surgery

## 2020-07-05 ENCOUNTER — Other Ambulatory Visit: Payer: Self-pay

## 2020-07-05 VITALS — BP 118/80 | HR 79 | Temp 97.3°F | Resp 16 | Ht 65.0 in | Wt 129.2 lb

## 2020-07-05 DIAGNOSIS — G43109 Migraine with aura, not intractable, without status migrainosus: Secondary | ICD-10-CM

## 2020-07-05 DIAGNOSIS — Z3041 Encounter for surveillance of contraceptive pills: Secondary | ICD-10-CM | POA: Diagnosis not present

## 2020-07-05 DIAGNOSIS — A6004 Herpesviral vulvovaginitis: Secondary | ICD-10-CM

## 2020-07-05 DIAGNOSIS — G8929 Other chronic pain: Secondary | ICD-10-CM

## 2020-07-05 DIAGNOSIS — Z0000 Encounter for general adult medical examination without abnormal findings: Secondary | ICD-10-CM | POA: Diagnosis not present

## 2020-07-05 DIAGNOSIS — F419 Anxiety disorder, unspecified: Secondary | ICD-10-CM

## 2020-07-05 DIAGNOSIS — L209 Atopic dermatitis, unspecified: Secondary | ICD-10-CM | POA: Insufficient documentation

## 2020-07-05 DIAGNOSIS — M25512 Pain in left shoulder: Secondary | ICD-10-CM

## 2020-07-05 DIAGNOSIS — K5904 Chronic idiopathic constipation: Secondary | ICD-10-CM

## 2020-07-05 NOTE — Progress Notes (Signed)
Careteam: Patient Care Team: Default, Provider, MD as PCP - General Zelphia CairoAdkins, Gretchen, MD as Consulting Physician (Obstetrics and Gynecology)  Seen by: Hazle NordmannAmy Keymari Sato, AGNP-C  [facility]ICE[facility]  [facility]  Advanced Directive information    No Known Allergies  No chief complaint on file.    HPI: Patient is a 30 y.o. female seen today to establish care at [facility].   Previous provider: She has not had a PCP for years. Previous [employer] student at [employer], she used their student health center for medical care. Since graduation a few years ago, she has used urgent care centers and emergency department for health issues. Moved to [place] after graduation for work. 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 Lives in apartment with boyfriend and aunt.   Uterine Fibroids- diagnosed around age 30. She was having bloating and lower abdominal pain. She has had a vaginal ultrasound done to confirm diagnosis. She has routine u/s done to check fibroid size, cannot remember when last one was done. Grandmother and aunt on mothers side have a history of uterine cancer.   Women's health- started taking birth control since 2011. Started Loestrin about 5 years ago. Denies blood clots, sob or calf pain. PAP done in November 2021. Repeat PAP in one year advised.   Genital herpes- diagnosed in 2016. Takes acyclovir as needed. No recent flare ups.    Migraines- frontal headaches average one a week. Prior to onset she describes her head feeling light, and then headache being a few hours later.  Takes Excedrin migraine, only one or two tablets, states they are helpful   Last menstrual Period: does not know. Uses birth control pack to help her with date. Periods described ad very light.   Constipation- seen by [facility] 02/2015 for increased abdominal pain and bloating. She was advised to follow high fiber diet, hydrate and use miralax every  3-4 days as needed. Future f/u prn. She will have episodes with constipation a few times a month. Miralax works well.   Diarrhea- she will sometimes use too much miralax and will have diarrhea. She will then have to take imodium.   Covid infection- She tested positive in July, 2021 after a trip to [place]. Symptoms included malaise, body aches, sinus congestion, cough, and sore throat.   Atopic dermatitis- Reports her skin itching after covid resolved. She has seen a dermatologist, triamcinolone cream given. Advised to take daily zyrtec.   Anxiety- started around college. 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 No recent panic attacks.   Chest pain- 12/2019 she presented to the ED with dull pain on her chest. EKG normal. She was advised to take vitamin E and magnesium after encounter. Also given muscle relaxer. She believes pain may have been related to left shoulder and neck pain. 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 Reports poor posture. Recently purchased a waist cincher for better posture.   Surgical history:  ORIF left distal radius 2014 by Dr. Dominica SeverinWilliam Gramig.   Specialists:  Gynecology- Fredia SorrowJami Brooke Chrzanowski NP with [facility]  Diet: eats out for most meals. Likes [place] and tacos. Eats apples and pears for snacks. Admits to eating fried foods. Drinks 1 bottle of water a day, also likes juice.   Exercise:does not exercise.   Sleep: she gets about 6-8  per night.   Alcohol: will drink about once a month a few cocktails  Drugs: tried cbd and marijuana in the past, did not like it. Denies using drugs.   Smoking: does not use tobacco products.   Dental exam: last dental exam in 2020, she had three root canals. Plans to schedule future appointment.   Eye exam: use to ware glasses, has not been sen by doctor since kid.   Review of Systems:  Review of Systems  Constitutional:  Negative for chills, fever and weight loss.  HENT: Negative for congestion and sore throat.   Eyes: Negative for blurred vision and double vision.  Respiratory: Negative for cough, shortness of breath and wheezing.   Cardiovascular: Negative for chest pain, palpitations and leg swelling.  Gastrointestinal: Positive for abdominal pain, constipation and diarrhea. Negative for blood in stool, heartburn, nausea and vomiting.  Genitourinary: Negative for dysuria and frequency.  Musculoskeletal: Positive for myalgias and neck pain.       Left shoulder and neck pain  Skin: Positive for rash.  Neurological: Negative for dizziness, weakness and headaches.  Psychiatric/Behavioral: Negative for depression and substance abuse. The patient is nervous/anxious. The patient does not have insomnia.     Past Medical History:  Diagnosis Date  . Anal fissure   . History of blood transfusion   . History of uterine fibroid    Per [facility] new patient packet   Past Surgical History:  Procedure Laterality Date  . CARPAL TUNNEL RELEASE Left 04/15/2012   Procedure: OPEN CARPAL TUNNEL RELEASE WITH REPAIR RECONSTRUCTION AS NECESSARY ;  Surgeon: Dominica Severin, MD;  Location: [facility];  Service: Orthopedics;  Laterality: Left;  . OPEN REDUCTION INTERNAL FIXATION (ORIF) DISTAL RADIAL FRACTURE Left 04/15/2012   Procedure: OPEN REDUCTION INTERNAL FIXATION (ORIF) LEFT DISTAL RADIUS FRACTURE;  Surgeon: Dominica Severin, MD;  Location: [facility];  Service: Orthopedics;  Laterality: Left;  . TOOTH EXTRACTION     Social History:   reports that she has never smoked. She has never used smokeless tobacco. She reports current alcohol use. She reports that she does not use drugs.  Family History  Problem Relation Age of Onset  . Hypertension Mother   . Hypertension Maternal Grandfather   . Colon cancer Neg Hx   . Esophageal cancer Neg Hx   . Pancreatic cancer Neg Hx   . Liver disease Neg Hx   . Kidney disease Neg Hx   . Diabetes Neg  Hx   . Heart disease Neg Hx     Medications: Patient's Medications  New Prescriptions   No medications on file  Previous Medications   ASPIRIN-ACETAMINOPHEN-CAFFEINE (EXCEDRIN MIGRAINE) 250-250-65 MG TABLET    Take by mouth as needed for headache.   BISMUTH SUBSALICYLATE (PEPTO-BISMOL PO)    Take by mouth as needed.   CETIRIZINE (ZYRTEC ALLERGY) 10 MG TABLET    Take 1 tablet (10 mg total) by mouth daily.   MAGNESIUM 250 MG TABS    Take 1 tablet by mouth daily.   NORETHINDRONE-ETHINYL ESTRADIOL-FE BIPHAS (LO LOESTRIN FE) 1 MG-10 MCG / 10 MCG TABLET    Take 1 tablet by mouth daily.   SENNOSIDES (EX-LAX PO)    Take by mouth as needed.   SIMETHICONE (GAS-X PO)    Take by mouth daily.   TIZANIDINE (ZANAFLEX) 4 MG TABLET    Take 1 tablet (4 mg total) by mouth every 6 (six) hours as needed for muscle spasms.   VALACYCLOVIR (VALTREX) 500 MG TABLET  TAKE ONE TABLET BY MOUTH ONCE DAILY   VITAMIN E PO    Take 180 mg by mouth daily.  Modified Medications   No medications on file  Discontinued Medications   No medications on file    Physical Exam:  There were no vitals filed for this visit. There is no height or weight on file to calculate BMI. Wt Readings from Last 3 Encounters:  09/24/19 120 lb (54.4 kg)  01/03/16 118 lb (53.5 kg)  03/07/15 121 lb 6.4 oz (55.1 kg)    Physical Exam Vitals reviewed.  Constitutional:      Appearance: Normal appearance.  HENT:     Head: Normocephalic and atraumatic.  Eyes:     General:        Right eye: No discharge.        Left eye: No discharge.     Conjunctiva/sclera: Conjunctivae normal.     Pupils: Pupils are equal, round, and reactive to light.  Cardiovascular:     Rate and Rhythm: Normal rate and regular rhythm.     Pulses: Normal pulses.     Heart sounds: Normal heart sounds.  Pulmonary:     Effort: Pulmonary effort is normal.     Breath sounds: Normal breath sounds.  Abdominal:     General: Abdomen is flat. Bowel sounds are normal.      Palpations: Abdomen is soft.  Musculoskeletal:        General: Normal range of motion.     Cervical back: Normal range of motion.     Right lower leg: No edema.     Left lower leg: No edema.     Comments: Left shoulder FROM, tenderness over subscapular region.  Lymphadenopathy:     Cervical: No cervical adenopathy.  Skin:    General: Skin is warm and dry.     Capillary Refill: Capillary refill takes less than 2 seconds.  Neurological:     General: No focal deficit present.     Mental Status: She is alert and oriented to person, place, and time.  Psychiatric:        Mood and Affect: Mood normal.        Behavior: Behavior normal.    Labs reviewed: Basic Metabolic Panel: No results for input(s): NA, K, CL, CO2, GLUCOSE, BUN, CREATININE, CALCIUM, MG, PHOS, TSH in the last 8760 hours. Liver Function Tests: No results for input(s): AST, ALT, ALKPHOS, BILITOT, PROT, ALBUMIN in the last 8760 hours. No results for input(s): LIPASE, AMYLASE in the last 8760 hours. No results for input(s): AMMONIA in the last 8760 hours. CBC: No results for input(s): WBC, NEUTROABS, HGB, HCT, MCV, PLT in the last 8760 hours. Lipid Panel: No results for input(s): CHOL, HDL, LDLCALC, TRIG, CHOLHDL, LDLDIRECT in the last 8760 hours. TSH: No results for input(s): TSH in the last 8760 hours. A1C: No results found for: HGBA1C   Assessment/Plan 1. Uses oral contraception - followed by gynecology - Loestrin use since 2016 - denies blood clots, sob or calf pain - light menstrual cysles  2. Herpes simplex vulvovaginitis - no recent flares - cont valacyclovir prn  3. Migraine with aura and without status migrainosus, not intractable - frontal headache, one a week for less than 48 hrs - may be related to neck and shoulder issues - cont Excedrin prn  4. Chronic idiopathic constipation - advised to hydrate with water daily, 4-6 8oz glasses - cont high fiber diet or use bene fiber  - cont miralax  prn - advised to stop taking imodium after using miralax  5. Atopic dermatitis, unspecified type - started after covid infection 2021 - seen dermatology- given triamcinolone cream  - cont daily zyrtec - advised to try Cerave lotion after bathing   6. Anxiety - no recent panic attacks - recommend light exercise like walking 150 min/week  7. Chronic left shoulder pain - poor posture due to prolonged computer and sewing machine use - recommend massage, daily stretching - cont zanaflex prn  Labs/tests: cbc/diff, cmp, lipid panel - future  Total time: 42 minutes. Greater than 50% of total time spent doing patient education and coordination of care regarding stress reduction, healthy diet, and pain management .    Next appt: 10/11/2020 Hazle Nordmann, Juel Burrow  [facility] [phone]

## 2020-07-05 NOTE — Patient Instructions (Signed)
[facility]  Managing Anxiety, Adult After being diagnosed with an anxiety disorder, you may be relieved to know why you have felt or behaved a certain way. You may also feel overwhelmed about the treatment ahead and what it will mean for your life. With care and support, you can manage this condition and recover from it. How to manage lifestyle changes Managing stress and anxiety Stress is your body's reaction to life changes and events, both good and bad. Most stress will last just a few hours, but stress can be ongoing and can lead to more than just stress. Although stress can play a major role in anxiety, it is not the same as anxiety. Stress is usually caused by something external, such as a deadline, test, or competition. Stress normally passes after the triggering event has ended.  Anxiety is caused by something internal, such as imagining a terrible outcome or worrying that something will go wrong that will devastate you. Anxiety often does not go away even after the triggering event is over, and it can become long-term (chronic) worry. It is important to understand the differences between stress and anxiety and to manage your stress effectively so that it does not lead to an anxious response. Talk with your health care provider or a counselor to learn more about reducing anxiety and stress. He or she may suggest tension reduction techniques, such as:  Music therapy. This can include creating or listening to music that you enjoy and that inspires you.  Mindfulness-based meditation. This involves being aware of your normal breaths while not trying to control your breathing. It can be done while sitting or walking.  Centering prayer. This involves focusing on a word, phrase, or sacred image that means something to you and brings you peace.  Deep breathing. To do this, expand your stomach and inhale slowly through your nose. Hold your breath for 3-5 seconds. Then exhale slowly, letting your  stomach muscles relax.  Self-talk. This involves identifying thought patterns that lead to anxiety reactions and changing those patterns.  Muscle relaxation. This involves tensing muscles and then relaxing them. Choose a tension reduction technique that suits your lifestyle and personality. These techniques take time and practice. Set aside 5-15 minutes a day to do them. Therapists can offer counseling and training in these techniques. The training to help with anxiety may be covered by some insurance plans. Other things you can do to manage stress and anxiety include:  Keeping a stress/anxiety diary. This can help you learn what triggers your reaction and then learn ways to manage your response.  Thinking about how you react to certain situations. You may not be able to control everything, but you can control your response.  Making time for activities that help you relax and not feeling guilty about spending your time in this way.  Visual imagery and yoga can help you stay calm and relax.   Medicines Medicines can help ease symptoms. Medicines for anxiety include:  Anti-anxiety drugs.  Antidepressants. Medicines are often used as a primary treatment for anxiety disorder. Medicines will be prescribed by a health care provider. When used together, medicines, psychotherapy, and tension reduction techniques may be the most effective treatment. Relationships Relationships can play a big part in helping you recover. Try to spend more time connecting with trusted friends and family members. Consider going to couples counseling, taking family education classes, or going to family therapy. Therapy can help you and others better understand your condition. How to  recognize changes in your anxiety Everyone responds differently to treatment for anxiety. Recovery from anxiety happens when symptoms decrease and stop interfering with your daily activities at home or work. This may mean that you will start  to:  Have better concentration and focus. Worry will interfere less in your daily thinking.  Sleep better.  Be less irritable.  Have more energy.  Have improved memory. It is important to recognize when your condition is getting worse. Contact your health care provider if your symptoms interfere with home or work and you feel like your condition is not improving. Follow these instructions at home: Activity  Exercise. Most adults should do the following: ? Exercise for at least 150 minutes each week. The exercise should increase your heart rate and make you sweat (moderate-intensity exercise). ? Strengthening exercises at least twice a week.  Get the right amount and quality of sleep. Most adults need 7-9 hours of sleep each night. Lifestyle  Eat a healthy diet that includes plenty of vegetables, fruits, whole grains, low-fat dairy products, and lean protein. Do not eat a lot of foods that are high in solid fats, added sugars, or salt.  Make choices that simplify your life.  Do not use any products that contain nicotine or tobacco, such as cigarettes, e-cigarettes, and chewing tobacco. If you need help quitting, ask your health care provider.  Avoid caffeine, alcohol, and certain over-the-counter cold medicines. These may make you feel worse. Ask your pharmacist which medicines to avoid.   General instructions  Take over-the-counter and prescription medicines only as told by your health care provider.  Keep all follow-up visits as told by your health care provider. This is important. Where to find support You can get help and support from these sources:  Self-help groups.  Online and [employer].  A trusted spiritual leader.  Couples counseling.  Family education classes.  Family therapy. Where to find more information You may find that joining a support group helps you deal with your anxiety. The following sources can help you locate counselors or support  groups near you:  Mental Health America: www.mentalhealthamerica.net  Anxiety and Depression Association of [place] (ADAA): ProgramCam.de  [employer] Mental Illness (NAMI): www.nami.org Contact a health care provider if you:  Have a hard time staying focused or finishing daily tasks.  Spend many hours a day feeling worried about everyday life.  Become exhausted by worry.  Start to have headaches, feel tense, or have nausea.  Urinate more than normal.  Have diarrhea. Get help right away if you have:  A racing heart and shortness of breath.  Thoughts of hurting yourself or others. If you ever feel like you may hurt yourself or others, or have thoughts about taking your own life, get help right away. You can go to your nearest emergency department or call:  Your local emergency services (911 in the [place]  A suicide crisis helpline, such as the National Suicide Prevention Lifeline at 908-003-2994. This is open 24 hours a day. Summary  Taking steps to learn and use tension reduction techniques can help calm you and help prevent triggering an anxiety reaction.  When used together, medicines, psychotherapy, and tension reduction techniques may be the most effective treatment.  Family, friends, and partners can play a big part in helping you recover from an anxiety disorder. This information is not intended to replace advice given to you by your health care provider. Make sure you discuss any questions you have with your  health care provider. Document Revised: 07/13/2018 Document Reviewed: 07/13/2018 Elsevier Patient Education  2021 [employer].

## 2020-10-08 ENCOUNTER — Other Ambulatory Visit: Payer: Self-pay

## 2020-10-08 ENCOUNTER — Other Ambulatory Visit: Payer: 59

## 2020-10-08 DIAGNOSIS — Z0000 Encounter for general adult medical examination without abnormal findings: Secondary | ICD-10-CM

## 2020-10-08 LAB — COMPREHENSIVE METABOLIC PANEL
AG Ratio: 1.2 (calc) (ref 1.0–2.5)
ALT: 7 U/L (ref 6–29)
AST: 17 U/L (ref 10–30)
Albumin: 4.2 g/dL (ref 3.6–5.1)
Alkaline phosphatase (APISO): 50 U/L (ref 31–125)
BUN: 11 mg/dL (ref 7–25)
CO2: 26 mmol/L (ref 20–32)
Calcium: 9.5 mg/dL (ref 8.6–10.2)
Chloride: 105 mmol/L (ref 98–110)
Creat: 0.63 mg/dL (ref 0.50–0.97)
Globulin: 3.4 g/dL (calc) (ref 1.9–3.7)
Glucose, Bld: 77 mg/dL (ref 65–99)
Potassium: 4.1 mmol/L (ref 3.5–5.3)
Sodium: 139 mmol/L (ref 135–146)
Total Bilirubin: 0.5 mg/dL (ref 0.2–1.2)
Total Protein: 7.6 g/dL (ref 6.1–8.1)

## 2020-10-08 LAB — CBC WITH DIFFERENTIAL/PLATELET
Absolute Monocytes: 448 cells/uL (ref 200–950)
Basophils Absolute: 28 cells/uL (ref 0–200)
Basophils Relative: 0.5 %
Eosinophils Absolute: 118 cells/uL (ref 15–500)
Eosinophils Relative: 2.1 %
HCT: 39.2 % (ref 35.0–45.0)
Hemoglobin: 13.5 g/dL (ref 11.7–15.5)
Lymphs Abs: 2134 cells/uL (ref 850–3900)
MCH: 31.5 pg (ref 27.0–33.0)
MCHC: 34.4 g/dL (ref 32.0–36.0)
MCV: 91.6 fL (ref 80.0–100.0)
MPV: 10.2 fL (ref 7.5–12.5)
Monocytes Relative: 8 %
Neutro Abs: 2873 cells/uL (ref 1500–7800)
Neutrophils Relative %: 51.3 %
Platelets: 328 10*3/uL (ref 140–400)
RBC: 4.28 10*6/uL (ref 3.80–5.10)
RDW: 10.9 % — ABNORMAL LOW (ref 11.0–15.0)
Total Lymphocyte: 38.1 %
WBC: 5.6 10*3/uL (ref 3.8–10.8)

## 2020-10-08 LAB — LIPID PANEL
Cholesterol: 206 mg/dL — ABNORMAL HIGH (ref <200)
HDL: 69 mg/dL (ref >=50)
LDL Cholesterol (Calc): 116 mg/dL (calc) — ABNORMAL HIGH
Non-HDL Cholesterol (Calc): 137 mg/dL (calc) — ABNORMAL HIGH (ref <130)
Total CHOL/HDL Ratio: 3 (calc) (ref <5.0)
Triglycerides: 105 mg/dL (ref <150)

## 2020-10-11 ENCOUNTER — Ambulatory Visit: Payer: 59 | Admitting: Orthopedic Surgery

## 2020-10-11 ENCOUNTER — Other Ambulatory Visit: Payer: Self-pay

## 2020-10-11 ENCOUNTER — Encounter: Payer: Self-pay | Admitting: Orthopedic Surgery

## 2020-10-11 VITALS — BP 120/70 | HR 82 | Temp 97.8°F | Ht 65.0 in | Wt 128.8 lb

## 2020-10-11 DIAGNOSIS — M25512 Pain in left shoulder: Secondary | ICD-10-CM

## 2020-10-11 DIAGNOSIS — K5904 Chronic idiopathic constipation: Secondary | ICD-10-CM | POA: Diagnosis not present

## 2020-10-11 DIAGNOSIS — A6004 Herpesviral vulvovaginitis: Secondary | ICD-10-CM | POA: Diagnosis not present

## 2020-10-11 DIAGNOSIS — G8929 Other chronic pain: Secondary | ICD-10-CM

## 2020-10-11 DIAGNOSIS — Z3041 Encounter for surveillance of contraceptive pills: Secondary | ICD-10-CM

## 2020-10-11 DIAGNOSIS — R140 Abdominal distension (gaseous): Secondary | ICD-10-CM

## 2020-10-11 DIAGNOSIS — G43109 Migraine with aura, not intractable, without status migrainosus: Secondary | ICD-10-CM | POA: Diagnosis not present

## 2020-10-11 DIAGNOSIS — M542 Cervicalgia: Secondary | ICD-10-CM

## 2020-10-11 DIAGNOSIS — Z23 Encounter for immunization: Secondary | ICD-10-CM

## 2020-10-11 MED ORDER — TIZANIDINE HCL 4 MG PO TABS: 4.0000 mg | ORAL_TABLET | Freq: Four times a day (QID) | ORAL | 0 refills | Status: DC | PRN

## 2020-10-11 NOTE — Progress Notes (Signed)
Careteam: Patient Care Team: Wendy Heir, NP as PCP - General (Adult Health Nurse Practitioner) [name], MD as Consulting Physician (Obstetrics and Gynecology)  Seen by: Hazle Nordmann, AGNP-C  PLACE OF SERVICE:  [facility]  Advanced Directive information    No Known Allergies  Chief Complaint  Patient presents with   Medical Management of Chronic Issues    Patient presents today for a 3 month follow-up.   Quality Metric Gaps    Per care gap in Epic patient is due for a pap smear, Hep C screening, tetanus/TDAP and flu vaccines.     HPI: Patient is a 30 y.o. female seen today for medical management of chronic conditions.   Labs discussed with patient.   LDL 116 10/08/2020. She will eat out several times a week. Discussed reducing foods high in fat and fried foods. Reports she has a family history of high cholesterol and does not wish to be on medication when she is older.   Still taking loestrin daily. Denies chest pain, sob and calf pain.   Constipation- she has been taking ducolax daily. Reports more diarrhea. Reports diet is not rich in fruits and vegetables. Seen GI in the past, they recommended metamucil. She reports taking it for awhile and then stopped months ago.   Migraines- she will take Excedrin migraine about 3 times a week. She reports some minor neck pain prior to headache. Believes using sewing machine and poor posture may be causing neck pain and headaches.   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 Requesting refill of zanaflex.   Bloating- will take prn Gas-x. Use to take probiotic, but not at this time.   Does not want flu vaccine  Discussed need for tetanus vaccination. Plans to schedule tetanus.    Review of Systems:  Review of Systems  Constitutional:  Negative for fever and weight loss.  HENT:  Negative for congestion.   Eyes:  Negative for blurred vision and double vision.  Respiratory:  Negative for cough,  shortness of breath and wheezing.   Cardiovascular:  Negative for chest pain and leg swelling.  Gastrointestinal:  Positive for constipation. Negative for abdominal pain, heartburn, nausea and vomiting.       Bloating  Genitourinary:  Negative for dysuria and hematuria.  Musculoskeletal:  Negative for back pain, joint pain, myalgias and neck pain.       Neck pain  Neurological:  Positive for headaches. Negative for dizziness and weakness.  Psychiatric/Behavioral:  Negative for depression. The patient is not nervous/anxious and does not have insomnia.    Past Medical History:  Diagnosis Date   Anal fissure    History of blood transfusion    History of uterine fibroid    Per [facility] new patient packet   Past Surgical History:  Procedure Laterality Date   CARPAL TUNNEL RELEASE Left 04/15/2012   Procedure: OPEN CARPAL TUNNEL RELEASE WITH REPAIR RECONSTRUCTION AS NECESSARY ;  Surgeon: Dominica Severin, MD;  Location: [facility] OR;  Service: Orthopedics;  Laterality: Left;   OPEN REDUCTION INTERNAL FIXATION (ORIF) DISTAL RADIAL FRACTURE Left 04/15/2012   Procedure: OPEN REDUCTION INTERNAL FIXATION (ORIF) LEFT DISTAL RADIUS FRACTURE;  Surgeon: Dominica Severin, MD;  Location: [facility] OR;  Service: Orthopedics;  Laterality: Left;   TOOTH EXTRACTION     Social History:   reports that she has never smoked. She has never used smokeless tobacco. She reports current alcohol use. She reports that she does not use drugs.  Family History  Problem Relation Age of Onset   Hypertension Mother    Hypertension Maternal Grandfather    Colon cancer Neg Hx    Esophageal cancer Neg Hx    Pancreatic cancer Neg Hx    Liver disease Neg Hx    Kidney disease Neg Hx    Diabetes Neg Hx    Heart disease Neg Hx     Medications: Patient's Medications  New Prescriptions   No medications on file  Previous Medications   CETIRIZINE (ZYRTEC ALLERGY) 10 MG TABLET    Take 1 tablet (10 mg total) by mouth daily.   NORETHIN-ETH  ESTRAD-FE BIPHAS (LO LOESTRIN FE PO)    Take 1 capsule by mouth daily.   TIZANIDINE (ZANAFLEX) 4 MG TABLET    Take 1 tablet (4 mg total) by mouth every 6 (six) hours as needed for muscle spasms.   VALACYCLOVIR (VALTREX) 500 MG TABLET    Take 500 mg by mouth as needed.  Modified Medications   No medications on file  Discontinued Medications   No medications on file    Physical Exam:  Vitals:   10/11/20 1104  BP: 120/70  Pulse: 82  Temp: 97.8 F (36.6 C)  TempSrc: Temporal  SpO2: 98%  Weight: 128 lb 12.8 oz (58.4 kg)  Height: 5\' 5"  (1.651 m)   Body mass index is 21.43 kg/m. Wt Readings from Last 3 Encounters:  10/11/20 128 lb 12.8 oz (58.4 kg)  07/05/20 129 lb 3.2 oz (58.6 kg)  09/24/19 120 lb (54.4 kg)    Physical Exam Vitals reviewed.  Constitutional:      General: She is not in acute distress. HENT:     Head: Normocephalic.  Cardiovascular:     Rate and Rhythm: Normal rate and regular rhythm.     Pulses: Normal pulses.     Heart sounds: Normal heart sounds.  Pulmonary:     Effort: Pulmonary effort is normal. No respiratory distress.     Breath sounds: Normal breath sounds. No wheezing.  Abdominal:     General: Bowel sounds are normal. There is no distension.     Palpations: Abdomen is soft.     Tenderness: There is no abdominal tenderness.  Musculoskeletal:     Right shoulder: Normal. No swelling, deformity or tenderness. Normal range of motion. Normal strength.     Left shoulder: Tenderness present. No swelling or deformity. Normal range of motion. Normal strength.     Cervical back: Normal range of motion. No tenderness.     Comments: Tenderness noted over subscapularis.   Lymphadenopathy:     Cervical: No cervical adenopathy.  Skin:    General: Skin is warm and dry.     Capillary Refill: Capillary refill takes less than 2 seconds.  Neurological:     General: No focal deficit present.     Mental Status: She is alert and oriented to person, place, and  time.  Psychiatric:        Mood and Affect: Mood normal.        Behavior: Behavior normal.    Labs reviewed: Basic Metabolic Panel: Recent Labs    10/08/20 0939  NA 139  K 4.1  CL 105  CO2 26  GLUCOSE 77  BUN 11  CREATININE 0.63  CALCIUM 9.5   Liver Function Tests: Recent Labs    10/08/20 0939  AST 17  ALT 7  BILITOT 0.5  PROT 7.6   No results for input(s): LIPASE, AMYLASE in the  last 8760 hours. No results for input(s): AMMONIA in the last 8760 hours. CBC: Recent Labs    10/08/20 0939  WBC 5.6  NEUTROABS 2,873  HGB 13.5  HCT 39.2  MCV 91.6  PLT 328   Lipid Panel: Recent Labs    10/08/20 0939  CHOL 206*  HDL 69  LDLCALC 116*  TRIG 105  CHOLHDL 3.0   TSH: No results for input(s): TSH in the last 8760 hours. A1C: No results found for: HGBA1C   Assessment/Plan 1. Uses oral contraception - followed by gynecology - loestrin use since 2016 - reports light, regular menstrual cycles  2. Herpes simplex vulvovaginitis - no recent flares - cont valacyclovir  3. Migraine with aura and without status migrainosus, not intractable - ongoing, intermittent headaches 1-2 times a week - cont excredrin prn  4. Chronic idiopathic constipation - seen by GI in past - reports diarrhea at this time - taking ducolax daily - diet rich in restaurant foods, low in fruits and vegetables - recommend restarting metamucil to bulk stools- start twice weekly and titrate up   5. Chronic left shoulder pain - tenderness over left subscapularis - cont daily stretching and massage - tiZANidine (ZANAFLEX) 4 MG tablet; Take 1 tablet (4 mg total) by mouth every 6 (six) hours as needed for muscle spasms.  Dispense: 30 tablet; Refill: 0  6. Bloating - cont gas-X - recommend starting daily probiotic  7. Neck pain - poor posture due to prolonged computer use and sewing machine use - recommend cervical pillow  - recommend daily stretching  8. Need for Tdap  vaccination -education discussed  Total time: 31 minutes. Greater than 50% of total time spent discussing symptom management and health maintenance.    Next appt: Visit date not found  Borna Wessinger Scherry Ran  [facility] [phone]

## 2020-10-11 NOTE — Patient Instructions (Addendum)
Follow low fat diet- mediterranean diet  Please try metamucil twice a week- may wean up to every other day  May try probiotic for bloating  Please schedule tetanus shot.

## 2021-01-21 ENCOUNTER — Other Ambulatory Visit: Payer: Self-pay

## 2021-01-21 ENCOUNTER — Encounter: Payer: Self-pay | Admitting: Orthopedic Surgery

## 2021-01-21 ENCOUNTER — Ambulatory Visit (INDEPENDENT_AMBULATORY_CARE_PROVIDER_SITE_OTHER): Payer: 59 | Admitting: Orthopedic Surgery

## 2021-01-21 VITALS — BP 120/80 | HR 99 | Temp 97.5°F | Resp 16 | Ht 65.0 in

## 2021-01-21 DIAGNOSIS — R6883 Chills (without fever): Secondary | ICD-10-CM | POA: Diagnosis not present

## 2021-01-21 DIAGNOSIS — R0602 Shortness of breath: Secondary | ICD-10-CM | POA: Diagnosis not present

## 2021-01-21 DIAGNOSIS — R5383 Other fatigue: Secondary | ICD-10-CM | POA: Diagnosis not present

## 2021-01-21 DIAGNOSIS — J029 Acute pharyngitis, unspecified: Secondary | ICD-10-CM | POA: Diagnosis not present

## 2021-01-21 LAB — POCT RAPID STREP A (OFFICE): Rapid Strep A Screen: NEGATIVE

## 2021-01-21 LAB — POCT INFLUENZA A/B
Influenza A, POC: NEGATIVE
Influenza B, POC: NEGATIVE

## 2021-01-21 MED ORDER — PREDNISONE 10 MG PO TABS: ORAL_TABLET | ORAL | 0 refills | Status: AC

## 2021-01-21 NOTE — Progress Notes (Signed)
Careteam: Patient Care Team: [name], NP as PCP - General (Adult Health Nurse Practitioner) [name], MD as Consulting Physician (Obstetrics and Gynecology)  Seen by: Hazle Nordmann, AGNP-C  PLACE OF SERVICE:  [facility]  Advanced Directive information Does Patient Have a Medical Advance Directive?: No, Would patient like information on creating a medical advance directive?: No - Patient declined  No Known Allergies  Chief Complaint  Patient presents with   Acute Visit    Patient complains of fatigue, SOB, Body aches, and Sore Throat.     HPI: Patient is a 30 y.o. female seen today for acute visit.   Sore throat began 11/26. Able to swallow fluids and foods without difficulty. Throat pain rated 4/10. Right side of throat more painful than left. She also reports nasal congestion with fatigue. Nasal congestion clear. She tried Affrin nasal spray, mucinex, and zyrtec without success. Denies fever, body aches, chills or ear pain.   She has not tested herself for covid. Admits to being out of town this past weekend. Unclear if she was around sick persons. She is not up to date on flu vaccine.     Review of Systems:  Review of Systems  Constitutional:  Positive for malaise/fatigue. Negative for chills, fever and weight loss.  HENT:  Positive for congestion and sore throat.   Eyes:  Negative for blurred vision and double vision.  Respiratory:  Negative for cough, shortness of breath and wheezing.   Cardiovascular:  Negative for chest pain and leg swelling.  Gastrointestinal:  Negative for abdominal pain, constipation, diarrhea, nausea and vomiting.  Musculoskeletal:  Negative for myalgias.  Skin: Negative.   Neurological:  Positive for headaches. Negative for dizziness and weakness.  Psychiatric/Behavioral:  Negative for depression and memory loss. The patient is not nervous/anxious.    Past Medical History:  Diagnosis Date   Anal fissure    History of blood  transfusion    History of uterine fibroid    Per [facility] new patient packet   Past Surgical History:  Procedure Laterality Date   CARPAL TUNNEL RELEASE Left 04/15/2012   Procedure: OPEN CARPAL TUNNEL RELEASE WITH REPAIR RECONSTRUCTION AS NECESSARY ;  Surgeon: Dominica Severin, MD;  Location: [facility] OR;  Service: Orthopedics;  Laterality: Left;   OPEN REDUCTION INTERNAL FIXATION (ORIF) DISTAL RADIAL FRACTURE Left 04/15/2012   Procedure: OPEN REDUCTION INTERNAL FIXATION (ORIF) LEFT DISTAL RADIUS FRACTURE;  Surgeon: Dominica Severin, MD;  Location: [facility] OR;  Service: Orthopedics;  Laterality: Left;   TOOTH EXTRACTION     Social History:   reports that she has never smoked. She has never used smokeless tobacco. She reports current alcohol use. She reports that she does not use drugs.  Family History  Problem Relation Age of Onset   Hypertension Mother    Hypertension Maternal Grandfather    Colon cancer Neg Hx    Esophageal cancer Neg Hx    Pancreatic cancer Neg Hx    Liver disease Neg Hx    Kidney disease Neg Hx    Diabetes Neg Hx    Heart disease Neg Hx     Medications: Patient's Medications  New Prescriptions   No medications on file  Previous Medications   BISACODYL (DULCOLAX) 5 MG EC TABLET    Take 5 mg by mouth daily as needed.   BISACODYL (DULCOLAX) 5 MG EC TABLET    Take 5 mg by mouth daily as needed.   CETIRIZINE (ZYRTEC ALLERGY) 10 MG TABLET  Take 1 tablet (10 mg total) by mouth daily.   NORETHIN-ETH ESTRAD-FE BIPHAS (LO LOESTRIN FE PO)    Take 1 capsule by mouth daily.   TIZANIDINE (ZANAFLEX) 4 MG TABLET    Take 1 tablet (4 mg total) by mouth every 6 (six) hours as needed for muscle spasms.   VALACYCLOVIR (VALTREX) 500 MG TABLET    Take 500 mg by mouth as needed.  Modified Medications   No medications on file  Discontinued Medications   No medications on file    Physical Exam:  Vitals:   01/21/21 1509  BP: 120/80  Pulse: 99  Resp: 16  Temp: (!) 97.5 F (36.4 C)  SpO2:  98%  Height: 5\' 5"  (1.651 m)   Body mass index is 21.43 kg/m. Wt Readings from Last 3 Encounters:  10/11/20 128 lb 12.8 oz (58.4 kg)  07/05/20 129 lb 3.2 oz (58.6 kg)  09/24/19 120 lb (54.4 kg)    Physical Exam Vitals reviewed.  Constitutional:      General: She is not in acute distress. HENT:     Head: Normocephalic.     Right Ear: There is no impacted cerumen.     Left Ear: There is no impacted cerumen.     Nose: Nose normal.     Mouth/Throat:     Mouth: Mucous membranes are moist.     Palate: No mass and lesions.     Pharynx: Uvula midline. Posterior oropharyngeal erythema present. No pharyngeal swelling or oropharyngeal exudate.     Tonsils: No tonsillar exudate or tonsillar abscesses.  Neck:     Vascular: No carotid bruit.  Cardiovascular:     Rate and Rhythm: Normal rate and regular rhythm.     Pulses: Normal pulses.     Heart sounds: Normal heart sounds. No murmur heard. Pulmonary:     Effort: Pulmonary effort is normal. No respiratory distress.     Breath sounds: Normal breath sounds. No wheezing.  Musculoskeletal:     Cervical back: Normal range of motion.  Lymphadenopathy:     Cervical: No cervical adenopathy.  Skin:    General: Skin is warm and dry.     Capillary Refill: Capillary refill takes less than 2 seconds.  Neurological:     General: No focal deficit present.     Mental Status: She is alert and oriented to person, place, and time.  Psychiatric:        Mood and Affect: Mood normal.        Behavior: Behavior normal.    Labs reviewed: Basic Metabolic Panel: Recent Labs    10/08/20 0939  NA 139  K 4.1  CL 105  CO2 26  GLUCOSE 77  BUN 11  CREATININE 0.63  CALCIUM 9.5   Liver Function Tests: Recent Labs    10/08/20 0939  AST 17  ALT 7  BILITOT 0.5  PROT 7.6   No results for input(s): LIPASE, AMYLASE in the last 8760 hours. No results for input(s): AMMONIA in the last 8760 hours. CBC: Recent Labs    10/08/20 0939  WBC 5.6   NEUTROABS 2,873  HGB 13.5  HCT 39.2  MCV 91.6  PLT 328   Lipid Panel: Recent Labs    10/08/20 0939  CHOL 206*  HDL 69  LDLCALC 116*  TRIG 105  CHOLHDL 3.0   TSH: No results for input(s): TSH in the last 8760 hours. A1C: No results found for: HGBA1C   Assessment/Plan  1. Sore throat -  pharynx with mild swelling and erythema - denies difficulty swallowing - POC Influenza A/B- negative - POC Rapid Strep A- negative - SARS-COV-2 RNA,(COVID-19) QUAL NAAT- pending - predniSONE (DELTASONE) 10 MG tablet; Take 4 tablets (40 mg total) by mouth daily with breakfast for 1 day, THEN 3 tablets (30 mg total) daily with breakfast for 1 day, THEN 2 tablets (20 mg total) daily with breakfast for 1 day, THEN 1 tablet (10 mg total) daily with breakfast for 1 day, THEN 0.5 tablets (5 mg total) daily with breakfast for 1 day.  Dispense: 10.5 tablet; Refill: 0  2. Fatigue, unspecified type - x 2 days - exam unremarkable - POC Influenza A/B negative - POC Rapid Strep A- negative - SARS-COV-2 RNA,(COVID-19) QUAL NAAT- pending  3. SOB (shortness of breath) - sats 98 % - lung sounds clear - POC Influenza A/B- negative - POC Rapid Strep A- negative - SARS-COV-2 RNA,(COVID-19) QUAL NAAT- pending  4. Chills - exam unremarkable - POC Influenza A/B - negative - POC Rapid Strep A- negative - SARS-COV-2 RNA,(COVID-19) QUAL NAAT- pending    Total time: 23 minutes. Greater than 50% of total time spent doing patient education on symptom and medication management regarding sore throat and cold symptoms.   Next appt: none Wendy Mccarty  [facility] [phone]

## 2021-01-21 NOTE — Patient Instructions (Signed)
Rest when you can  Take covid test at home  Start taking vitamin C 1000 mg daily and zinc 50 mg daily while feeling sick- stop zinc after you feel better, you may continue taking vitamin C 500 mg daily for prevention  Continue warm fluids to help soothe throat  Contact PCP if symptoms worsen or have difficulty swallowing fluids/foods  Start prednisone tomorrow morning- always take in morning

## 2021-01-22 LAB — SARS-COV-2 RNA,(COVID-19) QUALITATIVE NAAT: SARS CoV2 RNA: NOT DETECTED

## 2021-03-07 LAB — HM PAP SMEAR

## 2021-04-23 ENCOUNTER — Telehealth: Payer: Self-pay

## 2021-04-23 NOTE — Telephone Encounter (Signed)
Notes scanned to referral 

## 2021-04-26 ENCOUNTER — Other Ambulatory Visit: Payer: Self-pay

## 2021-04-26 ENCOUNTER — Encounter: Payer: Self-pay | Admitting: Internal Medicine

## 2021-04-26 ENCOUNTER — Ambulatory Visit: Payer: 59 | Admitting: Internal Medicine

## 2021-04-26 VITALS — BP 120/78 | HR 84 | Ht 65.0 in | Wt 135.0 lb

## 2021-04-26 DIAGNOSIS — R072 Precordial pain: Secondary | ICD-10-CM

## 2021-04-26 LAB — BASIC METABOLIC PANEL
BUN/Creatinine Ratio: 11 (ref 9–23)
BUN: 8 mg/dL (ref 6–20)
CO2: 24 mmol/L (ref 20–29)
Calcium: 9.5 mg/dL (ref 8.7–10.2)
Chloride: 101 mmol/L (ref 96–106)
Creatinine, Ser: 0.74 mg/dL (ref 0.57–1.00)
Glucose: 82 mg/dL (ref 70–99)
Potassium: 4.1 mmol/L (ref 3.5–5.2)
Sodium: 140 mmol/L (ref 134–144)
eGFR: 110 mL/min/{1.73_m2} (ref >59)

## 2021-04-26 MED ORDER — METOPROLOL TARTRATE 100 MG PO TABS: ORAL_TABLET | ORAL | 0 refills | Status: DC

## 2021-04-26 NOTE — Progress Notes (Signed)
?Cardiology Office Note:   ? ?Date:  04/26/2021  ? ?ID:  Wendy Mccarty, DOB September 29, 1990, MRN [id number] ? ?PCP:  [name], NP  ? ?[facility] Providers ?Cardiologist:  Alverda Skeans, MD ?Referring MD: [name], MD  ? ?Chief Complaint/Reason for Referral:  Chest pain ? ?ASSESSMENT:   ? ?Precordial pain ? ?PLAN:   ? ?In order of problems listed above: ? ?1.  The patient has developed a chest pain syndrome with some atypical symptoms.  We will obtain a coronary CTA and echocardiogram to evaluate further.  If the patient has mild obstructive coronary artery disease, they will require a statin (with goal LDL < 70) and aspirin, if they have high-grade disease we will need to consider optimal medical therapy and if symptoms are refractory to medical therapy, then a cardiac catheterization with possible PCI will be pursued to alleviate symptoms.  If they have high risk disease we will proceed directly to cardiac catheterization.  We will keep follow-up with me open-ended depending on the results of this testing.    ? ?     ? ?  ? ?Dispo:  Return if symptoms worsen or fail to improve.  ?  ? ?Medication Adjustments/Labs and Tests Ordered: ?Current medicines are reviewed at length with the patient today.  Concerns regarding medicines are outlined above.  ? ?Tests Ordered: ?No orders of the defined types were placed in this encounter. ? ? ?Medication Changes: ?No orders of the defined types were placed in this encounter. ? ? ?History of Present Illness:   ? ?The patient is a 31 y.o. female with the indicated medical history here for chest pain.  The patient was seen by her PCP recently and reported chest pain.  No EKG was performed.  The patient tells me that she has had chest discomfort.  This typically happens when she is working on her computer and not with exertion.  She did have an episode that woke her from sleep.  There is radiation down her left arm.  She denies any exertional dyspnea, presyncope, syncope,  palpitations, paroxysmal nocturnal dyspnea, orthopnea.  She does not smoke.   She is otherwise well without complaints. ? ?    ?  ?Previous Medical History: ?Past Medical History:  ?Diagnosis Date  ? Anal fissure   ? History of blood transfusion   ? History of uterine fibroid   ? Per [facility] new patient packet  ? ? ? ?Current Medications: ?Current Meds  ?Medication Sig  ? bisacodyl (DULCOLAX) 5 MG EC tablet Take 5 mg by mouth daily as needed.  ? cetirizine (ZYRTEC ALLERGY) 10 MG tablet Take 1 tablet (10 mg total) by mouth daily.  ? Norethin-Eth Estrad-Fe Biphas (LO LOESTRIN FE PO) Take 1 capsule by mouth daily.  ? tiZANidine (ZANAFLEX) 4 MG tablet Take 1 tablet (4 mg total) by mouth every 6 (six) hours as needed for muscle spasms.  ? valACYclovir (VALTREX) 500 MG tablet Take 500 mg by mouth as needed.  ?  ? ?Allergies:    ?Patient has no known allergies.  ? ?Social History:   ?Social History  ? ?Tobacco Use  ? Smoking status: Never  ? Smokeless tobacco: Never  ?Vaping Use  ? Vaping Use: Never used  ?Substance Use Topics  ? Alcohol use: Yes  ?  Alcohol/week: 0.0 standard drinks  ?  Comment: occasionally   ? Drug use: No  ?  ? ?Family Hx: ?Family History  ?Problem Relation Age of Onset  ?  Hypertension Mother   ? Hypertension Maternal Grandfather   ? Colon cancer Neg Hx   ? Esophageal cancer Neg Hx   ? Pancreatic cancer Neg Hx   ? Liver disease Neg Hx   ? Kidney disease Neg Hx   ? Diabetes Neg Hx   ? Heart disease Neg Hx   ?  ? ?Review of Systems:   ?Please see the history of present illness.    ?All other systems reviewed and are negative. ?  ? ? ?EKGs/Labs/Other Test Reviewed:   ? ?EKG:  EKG 2021 with NSR; EKG today demonstrates sinus rhythm ? ?Prior CV studies: ?None available ? ?Imaging studies that I have independently reviewed today: None available ? ?Recent Labs: ?10/08/2020: ALT 7; BUN 11; Creat 0.63; Hemoglobin 13.5; Platelets 328; Potassium 4.1; Sodium 139  ? ?Recent Lipid Panel ?Lab Results  ?Component Value  Date/Time  ? CHOL 206 (H) 10/08/2020 09:39 AM  ? TRIG 105 10/08/2020 09:39 AM  ? HDL 69 10/08/2020 09:39 AM  ? LDLCALC 116 (H) 10/08/2020 09:39 AM  ? ? ?Risk Assessment/Calculations:   ? ?  ?    ? ?Physical Exam:   ? ?VS:  BP 120/78   Pulse 84   Ht 5\' 5"  (1.651 m)   Wt 135 lb (61.2 kg)   SpO2 99%   BMI 22.47 kg/m?    ?Wt Readings from Last 3 Encounters:  ?04/26/21 135 lb (61.2 kg)  ?10/11/20 128 lb 12.8 oz (58.4 kg)  ?07/05/20 129 lb 3.2 oz (58.6 kg)  ?  ?GENERAL:  No apparent distress, AOx3 ?HEENT:  No carotid bruits, +2 carotid impulses, no scleral icterus ?CAR: RRR no murmurs, gallops, rubs, or thrills ?RES:  Clear to auscultation bilaterally ?ABD:  Soft, nontender, nondistended, positive bowel sounds x 4 ?VASC:  +2 radial pulses, +2 carotid pulses, palpable pedal pulses ?NEURO:  CN 2-12 grossly intact; motor and sensory grossly intact ?PSYCH:  No active depression or anxiety ?EXT:  No edema, ecchymosis, or cyanosis ? ?Signed, ?09/04/20, MD  ?04/26/2021 9:11 AM    ?[facility] ?[address] [place], [place], [place]  [place] ?Phone: [phone]; Fax: [phone]  ? ?Note:  This document was prepared using Dragon voice recognition software and may include unintentional dictation errors. ?

## 2021-04-26 NOTE — Patient Instructions (Addendum)
Medication Instructions:  ?Your physician recommends that you continue on your current medications as directed. Please refer to the Current Medication list given to you today. ? ?*If you need a refill on your cardiac medications before your next appointment, please call your pharmacy* ? ? ?Lab Work: ?TODAY: BMET ? ?If you have labs (blood work) drawn today and your tests are completely normal, you will receive your results only by: ?MyChart Message (if you have MyChart) OR ?A paper copy in the mail ?If you have any lab test that is abnormal or we need to change your treatment, we will call you to review the results. ? ? ?Testing/Procedures: ?Your physician has requested that you have an echocardiogram. Echocardiography is a painless test that uses sound waves to create images of your heart. It provides your doctor with information about the size and shape of your heart and how well your heart?s chambers and valves are working. This procedure takes approximately one hour. There are no restrictions for this procedure. ? ?Your physician has requested that you have cardiac CT.  ? ?Follow-Up: ?AS NEEDED :1}  ? ? ?Other Instructions ? ? ?Your cardiac CT will be scheduled at one of the below locations:  ? ?[facility] ?[address] ?[place], [place] [address] ?[phone] ? ?OR ? ?[facility] ?[address] ?[address] ?[place], [place] [address] ?[phone] ? ?If scheduled at [facility], please arrive at the [facility] and Children's Entrance (Entrance C2) of [facility] 30 minutes prior to test start time. ?You can use the FREE valet parking offered at entrance C (encouraged to control the heart rate for the test)  ?Proceed to the [facility] Radiology Department (first floor) to check-in and test prep. ? ?All radiology patients and guests should use entrance C2 at [facility], accessed from [facility], even though the hospital's  physical address listed is [address] ? ? ? ?If scheduled at [facility], please arrive 15 mins early for check-in and test prep. ? ?Please follow these instructions carefully (unless otherwise directed): ? ? ?On the Night Before the Test: ?Be sure to Drink plenty of water. ?Do not consume any caffeinated/decaffeinated beverages or chocolate 12 hours prior to your test. ?Do not take any antihistamines 12 hours prior to your test. ? ?On the Day of the Test: ?Drink plenty of water until 1 hour prior to the test. ?Do not eat any food 4 hours prior to the test. ?You may take your regular medications prior to the test.  ?Take metoprolol (Lopressor) 100 MG two hours prior to test. ?FEMALES- please wear underwire-free bra if available, avoid dresses & tight clothing ? ?     ?After the Test: ?Drink plenty of water. ?After receiving IV contrast, you may experience a mild flushed feeling. This is normal. ?On occasion, you may experience a mild rash up to 24 hours after the test. This is not dangerous. If this occurs, you can take Benadryl 25 mg and increase your fluid intake. ?If you experience trouble breathing, this can be serious. If it is severe call 911 IMMEDIATELY. If it is mild, please call our office. ? ?We will call to schedule your test 2-4 weeks out understanding that some insurance companies will need an authorization prior to the service being performed.  ? ?For non-scheduling related questions, please contact the cardiac imaging nurse navigator should you have any questions/concerns: ?Rockwell Alexandria, Cardiac Imaging Nurse Navigator ?  Larey Brick, Cardiac Imaging Nurse Navigator ?[facility] ?Direct Office Dial: [phone]  ? ?For scheduling needs, including cancellations and rescheduling, please call [place], [phone]. ?  ?

## 2021-04-30 ENCOUNTER — Other Ambulatory Visit: Payer: Self-pay

## 2021-04-30 ENCOUNTER — Ambulatory Visit (INDEPENDENT_AMBULATORY_CARE_PROVIDER_SITE_OTHER): Payer: 59 | Admitting: Family

## 2021-04-30 ENCOUNTER — Encounter: Payer: Self-pay | Admitting: Family

## 2021-04-30 VITALS — BP 120/84 | HR 101 | Temp 97.7°F | Resp 16 | Ht 65.0 in

## 2021-04-30 DIAGNOSIS — R059 Cough, unspecified: Secondary | ICD-10-CM

## 2021-04-30 DIAGNOSIS — J029 Acute pharyngitis, unspecified: Secondary | ICD-10-CM

## 2021-04-30 DIAGNOSIS — R52 Pain, unspecified: Secondary | ICD-10-CM

## 2021-04-30 DIAGNOSIS — R6883 Chills (without fever): Secondary | ICD-10-CM

## 2021-04-30 DIAGNOSIS — R0981 Nasal congestion: Secondary | ICD-10-CM

## 2021-04-30 DIAGNOSIS — H6501 Acute serous otitis media, right ear: Secondary | ICD-10-CM

## 2021-04-30 LAB — POCT RAPID STREP A (OFFICE): Rapid Strep A Screen: NEGATIVE

## 2021-04-30 MED ORDER — AMOXICILLIN-POT CLAVULANATE 875-125 MG PO TABS: 1.0000 | ORAL_TABLET | Freq: Two times a day (BID) | ORAL | 0 refills | Status: DC

## 2021-04-30 MED ORDER — CIPROFLOXACIN-DEXAMETHASONE 0.3-0.1 % OT SUSP: 4.0000 [drp] | Freq: Two times a day (BID) | OTIC | 0 refills | Status: DC

## 2021-04-30 NOTE — Progress Notes (Signed)
Provider: Kenyia Wambolt FNP-C  Yvonna Alanis, NP  Patient Care Team: Yvonna Alanis, NP as PCP - General (Adult Health Nurse Practitioner) Marylynn Pearson, MD as Consulting Physician (Obstetrics and Gynecology)  Extended Emergency Contact Information Primary Emergency Contact: Jillyn Ledger Mobile Phone: [phone] Relation: Other Preferred language: Cleophus Molt Interpreter needed? No Secondary Emergency Contact: Cromartie,Jackie Address: [address]          [place], [place] [address] [place] Phone: [phone] Relation: Grandmother  Code Status:  Full Code  Goals of care: Advanced Directive information Advanced Directives 04/30/2021  Does Patient Have a Medical Advance Directive? No  Does patient want to make changes to medical advance directive? -  Would patient like information on creating a medical advance directive? No - Patient declined  Pre-existing out of facility DNR order (yellow form or pink MOST form) -     Chief Complaint  Patient presents with   Acute Visit    Patient complains of cough, sore throat, congestion, ear pressure, chills, and body aches. Symptoms started 5 days ago.    HPI:  Pt is a 31 y.o. female seen today for an acute visit for evaluation of cough,sore throat ,congestion,ear pressure ,chills and body aches x 5 days  Denies any She denies any fever,runny nose,chest tightness,chest pain,palpitation or shortness of breath.Has taken mucinex,Tameflu,and tylenol  No exposure to person with COVID-19 infection  States no LMP.on oral contraceptives.    Past Medical History:  Diagnosis Date   Anal fissure    History of blood transfusion    History of uterine fibroid    Per [facility] new patient packet   Past Surgical History:  Procedure Laterality Date   CARPAL TUNNEL RELEASE Left 04/15/2012   Procedure: OPEN CARPAL TUNNEL RELEASE WITH REPAIR RECONSTRUCTION AS NECESSARY ;  Surgeon: Roseanne Kaufman, MD;  Location: [facility];   Service: Orthopedics;  Laterality: Left;   OPEN REDUCTION INTERNAL FIXATION (ORIF) DISTAL RADIAL FRACTURE Left 04/15/2012   Procedure: OPEN REDUCTION INTERNAL FIXATION (ORIF) LEFT DISTAL RADIUS FRACTURE;  Surgeon: Roseanne Kaufman, MD;  Location: [facility];  Service: Orthopedics;  Laterality: Left;   TOOTH EXTRACTION      No Known Allergies  Outpatient Encounter Medications as of 04/30/2021  Medication Sig   bisacodyl (DULCOLAX) 5 MG EC tablet Take 5 mg by mouth daily as needed.   cetirizine (ZYRTEC ALLERGY) 10 MG tablet Take 1 tablet (10 mg total) by mouth daily.   metoprolol tartrate (LOPRESSOR) 100 MG tablet Take 1 tablet by mouth 2 hours prior to Cardiac CT   Norethin-Eth Estrad-Fe Biphas (LO LOESTRIN FE PO) Take 1 capsule by mouth daily.   tiZANidine (ZANAFLEX) 4 MG tablet Take 1 tablet (4 mg total) by mouth every 6 (six) hours as needed for muscle spasms.   valACYclovir (VALTREX) 500 MG tablet Take 500 mg by mouth as needed.   [DISCONTINUED] FALMINA 0.1-20 MG-MCG tablet TAKE ONE TABLET BY MOUTH ONCE DAILY **DISCONTINUE SPRINTEC**   No facility-administered encounter medications on file as of 04/30/2021.    Review of Systems  Constitutional:  Positive for chills. Negative for appetite change, fatigue, fever and unexpected weight change.  HENT:  Positive for congestion, ear pain and sore throat. Negative for dental problem, ear discharge, facial swelling, hearing loss, nosebleeds, postnasal drip, rhinorrhea, sinus pressure, sinus pain, sneezing, tinnitus and trouble swallowing.   Eyes:  Negative for pain, discharge, redness, itching and visual disturbance.  Respiratory:  Positive for cough. Negative for chest  tightness, shortness of breath and wheezing.   Cardiovascular:  Negative for chest pain, palpitations and leg swelling.  Gastrointestinal:  Negative for abdominal distention, abdominal pain, blood in stool, constipation, diarrhea, nausea and vomiting.  Skin:  Negative for color change,  pallor and rash.  Neurological:  Negative for dizziness, weakness, light-headedness and headaches.  Hematological:  Does not bruise/bleed easily.    There is no immunization history on file for this patient. Pertinent  Health Maintenance Due  Topic Date Due   INFLUENZA VACCINE  Never done   PAP SMEAR-Modifier  03/07/2023   Fall Risk 01/24/2020 07/05/2020 10/11/2020 01/21/2021 04/30/2021  Falls in the past year? - 0 0 0 0  Was there an injury with Fall? - 0 0 0 0  Fall Risk Category Calculator - 0 0 0 0  Fall Risk Category - Low Low Low Low  Patient Fall Risk Level Low fall risk Low fall risk Low fall risk Low fall risk Low fall risk  Patient at Risk for Falls Due to - - No Fall Risks No Fall Risks No Fall Risks  Fall risk Follow up - - Falls evaluation completed;Education provided;Falls prevention discussed Falls evaluation completed Falls evaluation completed   Functional Status Survey:    Vitals:   04/30/21 0958  BP: 120/84  Pulse: (!) 101  Resp: 16  Temp: 97.7 F (36.5 C)  SpO2: 97%  Height: 5\' 5"  (1.651 m)   Body mass index is 22.47 kg/m. Physical Exam Vitals reviewed.  Constitutional:      General: She is not in acute distress.    Appearance: Normal appearance. She is normal weight. She is not ill-appearing or diaphoretic.  HENT:     Head: Normocephalic.     Right Ear: No decreased hearing noted. Tenderness present. No drainage. There is no impacted cerumen. Tympanic membrane is erythematous and bulging. Tympanic membrane is not injected, scarred or perforated.     Left Ear: Tympanic membrane, ear canal and external ear normal. There is no impacted cerumen.     Nose: Nose normal. No congestion or rhinorrhea.     Mouth/Throat:     Mouth: Mucous membranes are moist.     Pharynx: Posterior oropharyngeal erythema present. No oropharyngeal exudate.  Eyes:     General: No scleral icterus.       Right eye: No discharge.        Left eye: No discharge.     Extraocular  Movements: Extraocular movements intact.     Conjunctiva/sclera: Conjunctivae normal.     Pupils: Pupils are equal, round, and reactive to light.  Neck:     Vascular: No carotid bruit.  Cardiovascular:     Rate and Rhythm: Normal rate and regular rhythm.     Pulses: Normal pulses.     Heart sounds: Normal heart sounds. No murmur heard.   No friction rub. No gallop.  Pulmonary:     Effort: Pulmonary effort is normal. No respiratory distress.     Breath sounds: Normal breath sounds. No wheezing, rhonchi or rales.  Chest:     Chest wall: No tenderness.  Abdominal:     General: Bowel sounds are normal. There is no distension.     Palpations: Abdomen is soft. There is no mass.     Tenderness: There is no abdominal tenderness. There is no right CVA tenderness, left CVA tenderness, guarding or rebound.  Musculoskeletal:     Cervical back: Normal range of motion. No rigidity or tenderness.  Lymphadenopathy:  Cervical: No cervical adenopathy.  Skin:    General: Skin is warm and dry.     Coloration: Skin is not pale.     Findings: No erythema or rash.  Neurological:     Mental Status: She is alert and oriented to person, place, and time.     Motor: No weakness.     Gait: Gait normal.  Psychiatric:        Mood and Affect: Mood normal.        Speech: Speech normal.        Behavior: Behavior normal.    Labs reviewed: Recent Labs    10/08/20 0939 04/26/21 0932  NA 139 140  K 4.1 4.1  CL 105 101  CO2 26 24  GLUCOSE 77 82  BUN 11 8  CREATININE 0.63 0.74  CALCIUM 9.5 9.5   Recent Labs    10/08/20 0939  AST 17  ALT 7  BILITOT 0.5  PROT 7.6   Recent Labs    10/08/20 0939  WBC 5.6  NEUTROABS 2,873  HGB 13.5  HCT 39.2  MCV 91.6  PLT 328   Lab Results  Component Value Date   TSH 1.43 03/07/2015   No results found for: HGBA1C Lab Results  Component Value Date   CHOL 206 (H) 10/08/2020   HDL 69 10/08/2020   LDLCALC 116 (H) 10/08/2020   TRIG 105 10/08/2020    CHOLHDL 3.0 10/08/2020    Significant Diagnostic Results in last 30 days:  No results found.  Assessment/Plan  1. Cough, unspecified type Bilateral lung clear to Auscultation.suspect post nasal drip irritation. - OTC Robitussin DM as  needed for cough  - SARS-COV-2 RNA,(COVID-19) QUAL NAAT - POC Rapid Strep A results negative   2. Acute pharyngitis, unspecified etiology Posterior pharynx erythema noted .no exudate noted  - SARS-COV-2 RNA,(COVID-19) QUAL NAAT - POC Rapid Strep A results negative  - amoxicillin-clavulanate (AUGMENTIN) 875-125 MG tablet; Take 1 tablet by mouth 2 (two) times daily.  Dispense: 20 tablet; Refill: 0  3. Chills (without fever) Reports chills  Afebrile today  - advised to take OTC Tylenol as needed  - SARS-COV-2 RNA,(COVID-19) QUAL NAAT - POC Rapid Strep A   4. Body aches - advised to take OTC Tylenol as needed  - SARS-COV-2 RNA,(COVID-19) QUAL NAAT - POC Rapid Strep A  5. Nasal congestion Negative exam findings.  - continue on Cetirizine  - consider nasal saline spray  - SARS-COV-2 RNA,(COVID-19) QUAL NAAT - POC Rapid Strep A  6. Non-recurrent acute otitis media of right ear TM erythema and bulging Start on Augmentin as below side effects discussed  Ciprodex  - amoxicillin-clavulanate (AUGMENTIN) 875-125 MG tablet; Take 1 tablet by mouth 2 (two) times daily.  Dispense: 20 tablet; Refill: 0 - ciprofloxacin-dexamethasone (CIPRODEX) OTIC suspension; Place 4 drops into the right ear 2 (two) times daily.  Dispense: 7.5 mL; Refill: 0  Family/ staff Communication: Reviewed plan of care with patient verbalized understanding   Labs/tests ordered:  - SARS-COV-2 RNA,(COVID-19) QUAL NAAT - POC Rapid Strep A  Next Appointment:As needed if symptoms worsen or fail to improve     Sandrea Hughs, NP

## 2021-04-30 NOTE — Patient Instructions (Signed)
-   continue with tylenol for pain  ? ?- Robitussin DM as needed for cough  ? ?- Increase fluid intake 6-8 glasses of water daily or soup or tea  ? ?- Gurgle throat with warm water and salt once daily  ? ?

## 2021-05-01 LAB — SARS-COV-2 RNA,(COVID-19) QUALITATIVE NAAT: SARS CoV2 RNA: NOT DETECTED

## 2021-05-07 ENCOUNTER — Telehealth (HOSPITAL_COMMUNITY): Payer: Self-pay | Admitting: Emergency Medicine

## 2021-05-07 NOTE — Telephone Encounter (Signed)
Attempted to call patient regarding upcoming cardiac CT appointment. °Left message on voicemail with name and callback number °Fruma Africa RN Navigator Cardiac Imaging °[facility] °[phone] Office °[phone] Cell ° °

## 2021-05-07 NOTE — Telephone Encounter (Signed)
Reaching out to patient to offer assistance regarding upcoming cardiac imaging study; pt verbalizes understanding of appt date/time, parking situation and where to check in, pre-test NPO status and medications ordered, and verified current allergies; name and call back number provided for further questions should they arise ?Rockwell Alexandria RN Navigator Cardiac Imaging ? [facility] ?[phone] office ?[phone] cell ? ?Denies iv issues ?100mg  metoprolol tartrate ?Very nervous ?Arrival 245 ?

## 2021-05-09 ENCOUNTER — Ambulatory Visit (HOSPITAL_COMMUNITY)
Admission: RE | Admit: 2021-05-09 | Discharge: 2021-05-09 | Disposition: A | Discharge status: Home / Self Care | Payer: 59 | Source: Ambulatory Visit | Attending: Internal Medicine | Admitting: Internal Medicine

## 2021-05-09 ENCOUNTER — Other Ambulatory Visit: Payer: Self-pay

## 2021-05-09 DIAGNOSIS — R072 Precordial pain: Secondary | ICD-10-CM | POA: Insufficient documentation

## 2021-05-09 MED ORDER — NITROGLYCERIN 0.4 MG SL SUBL
0.8000 mg | SUBLINGUAL_TABLET | Freq: Once | SUBLINGUAL | Status: AC
  Administered 2021-05-09: 0.8 mg via SUBLINGUAL

## 2021-05-09 MED ORDER — IOHEXOL 350 MG/ML SOLN
100.0000 mL | Freq: Once | INTRAVENOUS | Status: AC | PRN
  Administered 2021-05-09: 100 mL via INTRAVENOUS

## 2021-05-09 MED ORDER — NITROGLYCERIN 0.4 MG SL SUBL
SUBLINGUAL_TABLET | SUBLINGUAL | Status: AC
  Filled 2021-05-09: qty 2

## 2021-05-14 ENCOUNTER — Ambulatory Visit (HOSPITAL_COMMUNITY): Payer: 59 | Attending: Cardiology

## 2021-05-14 ENCOUNTER — Other Ambulatory Visit: Payer: Self-pay

## 2021-05-14 DIAGNOSIS — R072 Precordial pain: Secondary | ICD-10-CM | POA: Diagnosis present

## 2021-05-14 LAB — ECHOCARDIOGRAM COMPLETE
Area-P 1/2: 3.3 cm2
S' Lateral: 3 cm

## 2021-07-04 ENCOUNTER — Other Ambulatory Visit: Payer: Self-pay | Admitting: Orthopedic Surgery

## 2021-07-04 DIAGNOSIS — G43109 Migraine with aura, not intractable, without status migrainosus: Secondary | ICD-10-CM

## 2021-07-04 DIAGNOSIS — R5383 Other fatigue: Secondary | ICD-10-CM

## 2021-10-14 ENCOUNTER — Other Ambulatory Visit: Payer: Self-pay

## 2021-10-14 DIAGNOSIS — R5383 Other fatigue: Secondary | ICD-10-CM

## 2021-10-14 DIAGNOSIS — G43109 Migraine with aura, not intractable, without status migrainosus: Secondary | ICD-10-CM

## 2021-10-15 ENCOUNTER — Other Ambulatory Visit: Payer: 59

## 2021-10-16 LAB — COMPREHENSIVE METABOLIC PANEL
AG Ratio: 1.2 (calc) (ref 1.0–2.5)
ALT: 6 U/L (ref 6–29)
AST: 14 U/L (ref 10–30)
Albumin: 4.1 g/dL (ref 3.6–5.1)
Alkaline phosphatase (APISO): 47 U/L (ref 31–125)
BUN: 10 mg/dL (ref 7–25)
CO2: 25 mmol/L (ref 20–32)
Calcium: 9.4 mg/dL (ref 8.6–10.2)
Chloride: 104 mmol/L (ref 98–110)
Creat: 0.76 mg/dL (ref 0.50–0.97)
Globulin: 3.3 g/dL (calc) (ref 1.9–3.7)
Glucose, Bld: 82 mg/dL (ref 65–139)
Potassium: 4.3 mmol/L (ref 3.5–5.3)
Sodium: 138 mmol/L (ref 135–146)
Total Bilirubin: 0.5 mg/dL (ref 0.2–1.2)
Total Protein: 7.4 g/dL (ref 6.1–8.1)

## 2021-10-16 LAB — CBC WITH DIFFERENTIAL/PLATELET
Absolute Monocytes: 573 cells/uL (ref 200–950)
Basophils Absolute: 41 cells/uL (ref 0–200)
Basophils Relative: 0.6 %
Eosinophils Absolute: 138 cells/uL (ref 15–500)
Eosinophils Relative: 2 %
HCT: 36.3 % (ref 35.0–45.0)
Hemoglobin: 12.6 g/dL (ref 11.7–15.5)
Lymphs Abs: 2905 cells/uL (ref 850–3900)
MCH: 32.2 pg (ref 27.0–33.0)
MCHC: 34.7 g/dL (ref 32.0–36.0)
MCV: 92.8 fL (ref 80.0–100.0)
MPV: 9.9 fL (ref 7.5–12.5)
Monocytes Relative: 8.3 %
Neutro Abs: 3243 cells/uL (ref 1500–7800)
Neutrophils Relative %: 47 %
Platelets: 306 10*3/uL (ref 140–400)
RBC: 3.91 10*6/uL (ref 3.80–5.10)
RDW: 11 % (ref 11.0–15.0)
Total Lymphocyte: 42.1 %
WBC: 6.9 10*3/uL (ref 3.8–10.8)

## 2021-10-17 ENCOUNTER — Encounter: Payer: Self-pay | Admitting: Orthopedic Surgery

## 2021-10-17 ENCOUNTER — Ambulatory Visit: Payer: 59 | Admitting: Orthopedic Surgery

## 2021-10-17 VITALS — BP 126/88 | HR 87 | Temp 97.3°F | Resp 20 | Ht 65.0 in | Wt 138.6 lb

## 2021-10-17 DIAGNOSIS — Z0000 Encounter for general adult medical examination without abnormal findings: Secondary | ICD-10-CM

## 2021-10-17 NOTE — Patient Instructions (Addendum)
You may continue Zyrtec or hydrocortisone 1% for skin irritation  Only use Affrin for 2-3 days at a time  May try saline nasal spray  Continue with Zyrtec daily- if allergies flare up use Xyzal for 2 weeks- then go back to Zyrtec  Please get flu vaccine this year

## 2021-10-17 NOTE — Progress Notes (Signed)
Careteam: Patient Care Team: [name], NP as PCP - General (Adult Health Nurse Practitioner) [name], MD as Consulting Physician (Obstetrics and Gynecology)  Seen by: Hazle Nordmann, AGNP-C  PLACE OF SERVICE:  [facility]  Advanced Directive information    No Known Allergies  Chief Complaint  Patient presents with   Annual Exam    Patient presents today for annual physical exam   Quality Metric Gaps    Hep C screening, TDAP, COVID     HPI: Patient is a 31 y.o. female seen today for annual physical exam.   No health concerns.   No recent injuries or hospitalizations.   BMI 23.06, vitals stable today.   Remains on Zyrtec for seasonal allergies.   Gynecology exam 02/2021, remains on birth control.   Evaluated by cardiology, LVEF 60-65%, cardiac CT recommended- not done, denies chest pain at this time, she believes it was anxiety.    Review of Systems:  Review of Systems  Constitutional:  Negative for chills, fever, malaise/fatigue and weight loss.  HENT:  Positive for congestion. Negative for sore throat.   Eyes:  Negative for blurred vision and double vision.  Respiratory:  Negative for cough, shortness of breath and wheezing.   Cardiovascular:  Negative for chest pain and leg swelling.  Gastrointestinal:  Negative for abdominal pain, constipation, diarrhea, heartburn, nausea and vomiting.  Genitourinary:  Negative for dysuria.  Musculoskeletal:  Negative for joint pain and myalgias.  Skin:  Positive for itching.  Neurological:  Negative for dizziness and weakness.  Endo/Heme/Allergies:  Positive for environmental allergies.  Psychiatric/Behavioral:  Negative for depression, substance abuse and suicidal ideas. The patient is nervous/anxious. The patient does not have insomnia.     Past Medical History:  Diagnosis Date   Anal fissure    History of blood transfusion    History of uterine fibroid    Per [facility] new patient packet   Past Surgical  History:  Procedure Laterality Date   CARPAL TUNNEL RELEASE Left 04/15/2012   Procedure: OPEN CARPAL TUNNEL RELEASE WITH REPAIR RECONSTRUCTION AS NECESSARY ;  Surgeon: Dominica Severin, MD;  Location: [facility] OR;  Service: Orthopedics;  Laterality: Left;   OPEN REDUCTION INTERNAL FIXATION (ORIF) DISTAL RADIAL FRACTURE Left 04/15/2012   Procedure: OPEN REDUCTION INTERNAL FIXATION (ORIF) LEFT DISTAL RADIUS FRACTURE;  Surgeon: Dominica Severin, MD;  Location: [facility] OR;  Service: Orthopedics;  Laterality: Left;   TOOTH EXTRACTION     Social History:   reports that she has never smoked. She has never used smokeless tobacco. She reports current alcohol use. She reports that she does not use drugs.  Family History  Problem Relation Age of Onset   Hypertension Mother    Hypertension Maternal Grandfather    Colon cancer Neg Hx    Esophageal cancer Neg Hx    Pancreatic cancer Neg Hx    Liver disease Neg Hx    Kidney disease Neg Hx    Diabetes Neg Hx    Heart disease Neg Hx     Medications: Patient's Medications  New Prescriptions   No medications on file  Previous Medications   BISACODYL (DULCOLAX) 5 MG EC TABLET    Take 5 mg by mouth daily as needed.   CETIRIZINE (ZYRTEC ALLERGY) 10 MG TABLET    Take 1 tablet (10 mg total) by mouth daily.   METOPROLOL TARTRATE (LOPRESSOR) 100 MG TABLET    Take 1 tablet by mouth 2 hours prior to Cardiac CT  NORETHIN-ETH ESTRAD-FE BIPHAS (LO LOESTRIN FE PO)    Take 1 capsule by mouth daily.   TIZANIDINE (ZANAFLEX) 4 MG TABLET    Take 1 tablet (4 mg total) by mouth every 6 (six) hours as needed for muscle spasms.   VALACYCLOVIR (VALTREX) 500 MG TABLET    Take 500 mg by mouth as needed.  Modified Medications   No medications on file  Discontinued Medications   AMOXICILLIN-CLAVULANATE (AUGMENTIN) 875-125 MG TABLET    Take 1 tablet by mouth 2 (two) times daily.   CIPROFLOXACIN-DEXAMETHASONE (CIPRODEX) OTIC SUSPENSION    Place 4 drops into the right ear 2 (two) times  daily.    Physical Exam:  Vitals:   10/17/21 1105  BP: 126/88  Pulse: 87  Resp: 20  Temp: (!) 97.3 F (36.3 C)  SpO2: 98%  Weight: 138 lb 9.6 oz (62.9 kg)  Height: 5\' 5"  (1.651 m)   Body mass index is 23.06 kg/m. Wt Readings from Last 3 Encounters:  10/17/21 138 lb 9.6 oz (62.9 kg)  04/26/21 135 lb (61.2 kg)  10/11/20 128 lb 12.8 oz (58.4 kg)    Physical Exam Vitals reviewed.  Constitutional:      General: She is not in acute distress. HENT:     Head: Normocephalic.     Right Ear: There is no impacted cerumen.     Left Ear: There is no impacted cerumen.     Nose: Nose normal.     Mouth/Throat:     Mouth: Mucous membranes are moist.  Eyes:     General:        Right eye: No discharge.        Left eye: No discharge.  Cardiovascular:     Rate and Rhythm: Normal rate and regular rhythm.     Pulses: Normal pulses.     Heart sounds: Normal heart sounds.  Pulmonary:     Effort: Pulmonary effort is normal. No respiratory distress.     Breath sounds: Normal breath sounds. No wheezing.  Abdominal:     General: Bowel sounds are normal. There is no distension.     Palpations: Abdomen is soft.     Tenderness: There is no abdominal tenderness.  Musculoskeletal:     Cervical back: Neck supple.     Right lower leg: No edema.     Left lower leg: No edema.  Skin:    General: Skin is warm and dry.     Capillary Refill: Capillary refill takes less than 2 seconds.  Neurological:     General: No focal deficit present.     Mental Status: She is alert and oriented to person, place, and time.  Psychiatric:        Mood and Affect: Mood normal.        Behavior: Behavior normal.     Labs reviewed: Basic Metabolic Panel: Recent Labs    04/26/21 0932 10/15/21 0834  NA 140 138  K 4.1 4.3  CL 101 104  CO2 24 25  GLUCOSE 82 82  BUN 8 10  CREATININE 0.74 0.76  CALCIUM 9.5 9.4   Liver Function Tests: Recent Labs    10/15/21 0834  AST 14  ALT 6  BILITOT 0.5  PROT  7.4   No results for input(s): "LIPASE", "AMYLASE" in the last 8760 hours. No results for input(s): "AMMONIA" in the last 8760 hours. CBC: Recent Labs    10/15/21 0834  WBC 6.9  NEUTROABS 3,243  HGB 12.6  HCT  36.3  MCV 92.8  PLT 306   Lipid Panel: No results for input(s): "CHOL", "HDL", "LDLCALC", "TRIG", "CHOLHDL", "LDLDIRECT" in the last 8760 hours. TSH: No results for input(s): "TSH" in the last 8760 hours. A1C: No results found for: "HGBA1C"   Assessment/Plan 1. Annual physical exam - 08/22 cbc/diff unremarkable - 08/22 cmp unremarkable - BMI 23.06 - blood pressure 126/88 - 04/26/2021 EKG- NSR   Total time: 25 minutes. Greater than 50% of total time spent doing patient education regarding health maintenance, flu vaccine, seasonal allergy management.    Next appt: Visit date not found  [place] [phone]

## 2021-11-22 ENCOUNTER — Ambulatory Visit: Payer: 59 | Admitting: Adult Health

## 2021-11-22 ENCOUNTER — Encounter: Payer: Self-pay | Admitting: Adult Health

## 2021-11-22 VITALS — BP 118/74 | HR 85 | Temp 97.3°F | Resp 18 | Ht 65.0 in | Wt 141.0 lb

## 2021-11-22 DIAGNOSIS — N898 Other specified noninflammatory disorders of vagina: Secondary | ICD-10-CM | POA: Diagnosis not present

## 2021-11-22 LAB — POCT URINALYSIS DIPSTICK
Glucose, UA: NEGATIVE
Ketones, UA: NEGATIVE
Nitrite, UA: NEGATIVE
Odor: NORMAL
Protein, UA: NEGATIVE
Spec Grav, UA: 1.015 (ref 1.010–1.025)
Urobilinogen, UA: 0.2 E.U./dL
pH, UA: 6.5 (ref 5.0–8.0)

## 2021-11-22 NOTE — Progress Notes (Unsigned)
[facility] clinic  Provider:  Durenda Age DNP  Code Status:  Full Code  Goals of Care:     04/30/2021    9:59 AM  Advanced Directives  Does Patient Have a Medical Advance Directive? No  Would patient like information on creating a medical advance directive? No - Patient declined     Chief Complaint  Patient presents with   Acute Visit    Patient is here for vaginal discharge for about 5 days no other symptoms no irregular odor .OB could not get her in for appt.    HPI: Patient is a 31 y.o. female seen today for an acute visit for vaginal discharge. She stated that she has history of Chlamydia, gonorrhea and trichomonas. A week ago, she had whitish "chunky" vaginal discharge so she had a one time dose of monistat. Today, she presented to the clinic with complaints of thin yellowish discharge which started a week ago. She denies itching, hematuria nor chills. No noted rashes or lesions on her vaginal area.  Past Medical History:  Diagnosis Date   Anal fissure    History of blood transfusion    History of uterine fibroid    Per [facility] new patient packet    Past Surgical History:  Procedure Laterality Date   CARPAL TUNNEL RELEASE Left 04/15/2012   Procedure: OPEN CARPAL TUNNEL RELEASE WITH REPAIR RECONSTRUCTION AS NECESSARY ;  Surgeon: Roseanne Kaufman, MD;  Location: [place];  Service: Orthopedics;  Laterality: Left;   OPEN REDUCTION INTERNAL FIXATION (ORIF) DISTAL RADIAL FRACTURE Left 04/15/2012   Procedure: OPEN REDUCTION INTERNAL FIXATION (ORIF) LEFT DISTAL RADIUS FRACTURE;  Surgeon: Roseanne Kaufman, MD;  Location: [place];  Service: Orthopedics;  Laterality: Left;   TOOTH EXTRACTION      No Known Allergies  Outpatient Encounter Medications as of 11/22/2021  Medication Sig   bisacodyl (DULCOLAX) 5 MG EC tablet Take 5 mg by mouth daily as needed.   cetirizine (ZYRTEC ALLERGY) 10 MG tablet Take 1 tablet (10 mg total) by mouth daily.   metoprolol tartrate (LOPRESSOR) 100 MG tablet  Take 1 tablet by mouth 2 hours prior to Cardiac CT   Norethin-Eth Estrad-Fe Biphas (LO LOESTRIN FE PO) Take 1 capsule by mouth daily.   tiZANidine (ZANAFLEX) 4 MG tablet Take 1 tablet (4 mg total) by mouth every 6 (six) hours as needed for muscle spasms.   valACYclovir (VALTREX) 500 MG tablet Take 500 mg by mouth as needed.   [DISCONTINUED] FALMINA 0.1-20 MG-MCG tablet TAKE ONE TABLET BY MOUTH ONCE DAILY **DISCONTINUE SPRINTEC**   No facility-administered encounter medications on file as of 11/22/2021.    Review of Systems:  Review of Systems  Constitutional:  Negative for appetite change, chills, fatigue and fever.  HENT:  Negative for congestion, hearing loss, rhinorrhea and sore throat.   Eyes: Negative.   Respiratory:  Negative for cough, shortness of breath and wheezing.   Cardiovascular:  Negative for chest pain, palpitations and leg swelling.  Gastrointestinal:  Negative for abdominal pain, constipation, diarrhea, nausea and vomiting.  Genitourinary:  Positive for vaginal discharge. Negative for dysuria, urgency, vaginal bleeding and vaginal pain.  Musculoskeletal:  Negative for arthralgias, back pain and myalgias.  Skin:  Negative for color change, rash and wound.  Neurological:  Negative for dizziness, weakness and headaches.  Psychiatric/Behavioral:  Negative for behavioral problems. The patient is not nervous/anxious.     Health Maintenance  Topic Date Due   COVID-19 Vaccine (1) Never done   Hepatitis C  Screening  Never done   TETANUS/TDAP  Never done   INFLUENZA VACCINE  Never done   PAP SMEAR-Modifier  03/07/2023   HIV Screening  Completed   HPV VACCINES  Aged Out    Physical Exam: Vitals:   11/22/21 0859  BP: 118/74  Pulse: 85  Resp: 18  Temp: (!) 97.3 F (36.3 C)  SpO2: 99%  Weight: 141 lb (64 kg)  Height: 5\' 5"  (1.651 m)   Body mass index is 23.46 kg/m. Physical Exam Constitutional:      Appearance: Normal appearance.  HENT:     Head: Normocephalic  and atraumatic.     Nose: Nose normal.     Mouth/Throat:     Mouth: Mucous membranes are moist.  Eyes:     Conjunctiva/sclera: Conjunctivae normal.  Cardiovascular:     Rate and Rhythm: Normal rate and regular rhythm.  Pulmonary:     Effort: Pulmonary effort is normal.     Breath sounds: Normal breath sounds.  Abdominal:     General: Bowel sounds are normal.     Palpations: Abdomen is soft.  Genitourinary:    General: Normal vulva.     Vagina: No vaginal discharge.  Musculoskeletal:        General: Normal range of motion.     Cervical back: Normal range of motion.  Skin:    General: Skin is warm and dry.  Neurological:     General: No focal deficit present.     Mental Status: She is alert and oriented to person, place, and time.  Psychiatric:        Mood and Affect: Mood normal.        Behavior: Behavior normal.        Thought Content: Thought content normal.        Judgment: Judgment normal.     Labs reviewed: Basic Metabolic Panel: Recent Labs    04/26/21 0932 10/15/21 0834  NA 140 138  K 4.1 4.3  CL 101 104  CO2 24 25  GLUCOSE 82 82  BUN 8 10  CREATININE 0.74 0.76  CALCIUM 9.5 9.4   Liver Function Tests: Recent Labs    10/15/21 0834  AST 14  ALT 6  BILITOT 0.5  PROT 7.4   No results for input(s): "LIPASE", "AMYLASE" in the last 8760 hours. No results for input(s): "AMMONIA" in the last 8760 hours. CBC: Recent Labs    10/15/21 0834  WBC 6.9  NEUTROABS 3,243  HGB 12.6  HCT 36.3  MCV 92.8  PLT 306   Lipid Panel: No results for input(s): "CHOL", "HDL", "LDLCALC", "TRIG", "CHOLHDL", "LDLDIRECT" in the last 8760 hours. No results found for: "HGBA1C"  Procedures since last visit: No results found.  Assessment/Plan 1. Vaginal discharge -  vaginal area has no rashes or any lesions -   SureSwab bacterial vaginosis, SureSwab Candida Vaginitis and Trichomonas vaginalis - POC Urinalysis Dipstick - Culture, Urine    Labs/tests ordered:   POC  Urinalysis Dipstick, SureSwab bacterial vaginosis, SureSwab Candida Vaginitis and Trichomonas vaginalis - Culture, Urine  Next appt:  as needed

## 2021-11-23 LAB — URINE CULTURE
MICRO NUMBER:: 13987839
SPECIMEN QUALITY:: ADEQUATE

## 2021-11-23 LAB — SURESWAB® ADVANCED CANDIDA VAGINITIS(CV)/TRICH VAG(TV)TMA
CANDIDA SPECIES: NOT DETECTED
Candida glabrata: NOT DETECTED
TRICHOMONAS VAGINALIS (TV),TMA: NOT DETECTED

## 2021-11-23 LAB — SURESWAB® ADVANCED BACTERIAL VAGINOSIS (BV), TMA: SURESWAB(R) ADV BACTERIAL VAGINOSIS(BV),TMA: NEGATIVE

## 2021-11-27 NOTE — Progress Notes (Signed)
-    Bacterial vaginosis is negative -   urine culture has mixed genital flora which is likely due to contamination during specimen collection -   Candida species and Trichomonas negative -  No new order.

## 2021-11-29 ENCOUNTER — Other Ambulatory Visit: Payer: Self-pay

## 2021-11-29 DIAGNOSIS — N898 Other specified noninflammatory disorders of vagina: Secondary | ICD-10-CM

## 2021-12-02 ENCOUNTER — Other Ambulatory Visit: Payer: 59

## 2021-12-06 ENCOUNTER — Other Ambulatory Visit: Payer: Self-pay | Admitting: Adult Health

## 2021-12-06 MED ORDER — DOXYCYCLINE HYCLATE 100 MG PO TABS: 100.0000 mg | ORAL_TABLET | Freq: Two times a day (BID) | ORAL | 0 refills | Status: AC

## 2021-12-06 NOTE — Progress Notes (Signed)
Lab showed + chlamydia trachomatis so I sent  e-prescription for Doxycycline at [facility]. Pls take it with food. Thanks.

## 2021-12-11 LAB — CHLAMYDIA TRACHOMATIS,TMA(ALTERNATE TARGET),UROGENTIAL: Chlamydia Trachomatis,TMA(ALT Target),Urogenital: DETECTED — AB

## 2021-12-11 LAB — CHLAMYDIA/NEISSERIA GONORRHOEAE RNA,TMA,UROGENTIAL
C. trachomatis RNA, TMA: DETECTED — AB
N. gonorrhoeae RNA, TMA: NOT DETECTED

## 2021-12-20 ENCOUNTER — Telehealth: Payer: Self-pay

## 2021-12-20 ENCOUNTER — Other Ambulatory Visit: Payer: Self-pay | Admitting: Orthopedic Surgery

## 2021-12-20 DIAGNOSIS — F419 Anxiety disorder, unspecified: Secondary | ICD-10-CM

## 2021-12-20 NOTE — Telephone Encounter (Signed)
Patient called and requested a referral for psych. For evaluation for anxiety/ panic attack

## 2021-12-20 NOTE — Telephone Encounter (Signed)
Referral made. She may also contact [facility]- [phone]. She is also welcome to schedule virtual/office appointment to [facility] if wait is too long. Please let her know we are always here to help.

## 2022-01-05 ENCOUNTER — Encounter (HOSPITAL_COMMUNITY): Payer: Self-pay

## 2022-01-05 ENCOUNTER — Emergency Department (HOSPITAL_COMMUNITY): Payer: 59

## 2022-01-05 ENCOUNTER — Other Ambulatory Visit: Payer: Self-pay

## 2022-01-05 ENCOUNTER — Emergency Department (HOSPITAL_COMMUNITY)
Admission: EM | Admit: 2022-01-05 | Discharge: 2022-01-05 | Disposition: A | Discharge status: Home / Self Care | Payer: 59 | Attending: Student | Admitting: Student

## 2022-01-05 DIAGNOSIS — S99921A Unspecified injury of right foot, initial encounter: Secondary | ICD-10-CM | POA: Diagnosis present

## 2022-01-05 DIAGNOSIS — Z79899 Other long term (current) drug therapy: Secondary | ICD-10-CM | POA: Insufficient documentation

## 2022-01-05 DIAGNOSIS — S92414A Nondisplaced fracture of proximal phalanx of right great toe, initial encounter for closed fracture: Secondary | ICD-10-CM | POA: Diagnosis not present

## 2022-01-05 DIAGNOSIS — W208XXA Other cause of strike by thrown, projected or falling object, initial encounter: Secondary | ICD-10-CM | POA: Insufficient documentation

## 2022-01-05 MED ORDER — TRAMADOL HCL 50 MG PO TABS
50.0000 mg | ORAL_TABLET | Freq: Once | ORAL | Status: AC
  Administered 2022-01-05: 50 mg via ORAL
  Filled 2022-01-05: qty 1

## 2022-01-05 MED ORDER — NAPROXEN 500 MG PO TABS: 500.0000 mg | ORAL_TABLET | Freq: Two times a day (BID) | ORAL | 0 refills | Status: AC | PRN

## 2022-01-05 NOTE — ED Triage Notes (Signed)
Dropped a book shelf on the rt foot at 8 pm last night.  Is swollen under the rt great toe, hurts to walk on it, is throbbing.

## 2022-01-05 NOTE — ED Provider Notes (Signed)
[facility]-EMERGENCY DEPT Provider Note   CSN: [id number] Arrival date & time: 01/05/22  0040     History  Chief Complaint  Patient presents with   Foot Injury    Wendy Mccarty is a 31 y.o. female.  31 year old presents to the emergency department for evaluation of pain to the base of her right great toe.  Reports dropping a bookshelf on her foot at [employer].  She tried some ibuprofen for pain without relief.  Pain is worse with ambulation.  It is described as throbbing.  No associated numbness or paresthesias.   Foot Injury      Home Medications Prior to Admission medications   Medication Sig Start Date End Date Taking? Authorizing Provider  naproxen (NAPROSYN) 500 MG tablet Take 1 tablet (500 mg total) by mouth every 12 (twelve) hours as needed for mild pain or moderate pain. 01/05/22  Yes Antony Madura, PA-C  bisacodyl (DULCOLAX) 5 MG EC tablet Take 5 mg by mouth daily as needed.    [provider]  cetirizine (ZYRTEC ALLERGY) 10 MG tablet Take 1 tablet (10 mg total) by mouth daily. 09/24/19   Wallis Bamberg, PA-C  metoprolol tartrate (LOPRESSOR) 100 MG tablet Take 1 tablet by mouth 2 hours prior to Cardiac CT 04/26/21   Orbie Pyo, MD  Norethin-Eth Estrad-Fe Biphas (LO LOESTRIN FE PO) Take 1 capsule by mouth daily.    [provider]  tiZANidine (ZANAFLEX) 4 MG tablet Take 1 tablet (4 mg total) by mouth every 6 (six) hours as needed for muscle spasms. 10/11/20   Fargo, Amy E, NP  valACYclovir (VALTREX) 500 MG tablet Take 500 mg by mouth as needed.    [provider]  FALMINA 0.1-20 MG-MCG tablet TAKE ONE TABLET BY MOUTH ONCE DAILY **DISCONTINUE SPRINTEC** 11/01/16 09/24/19  Brock Bad, MD      Allergies    Patient has no known allergies.    Review of Systems   Review of Systems Ten systems reviewed and are negative for acute change, except as noted in the HPI.    Physical Exam Updated Vital Signs BP 135/88 (BP  Location: Right Arm)   Pulse 70   Temp 98.3 F (36.8 C) (Oral)   Resp 18   Ht 5\' 5"  (1.651 m)   Wt 60.8 kg   SpO2 100%   BMI 22.30 kg/m   Physical Exam Vitals and nursing note reviewed.  Constitutional:      General: She is not in acute distress.    Appearance: She is well-developed. She is not diaphoretic.     Comments: Nontoxic appearing and in NAD  HENT:     Head: Normocephalic and atraumatic.  Eyes:     General: No scleral icterus.    Conjunctiva/sclera: Conjunctivae normal.  Pulmonary:     Effort: Pulmonary effort is normal. No respiratory distress.     Comments: Respirations even and unlabored Musculoskeletal:        General: Normal range of motion.     Cervical back: Normal range of motion.     Comments: Small linear blood blister at the dorsal aspect of the MTP of the R great toe. TTP at this area.  Patient can wiggle all toes.  Normal sensation to all digits of the L foot.  Skin:    General: Skin is warm and dry.     Coloration: Skin is not pale.     Findings: No erythema or rash.  Neurological:  Mental Status: She is alert and oriented to person, place, and time.     Sensory: No sensory deficit.     Coordination: Coordination normal.  Psychiatric:        Behavior: Behavior normal.     ED Results / Procedures / Treatments   Labs (all labs ordered are listed, but only abnormal results are displayed) Labs Reviewed - No data to display  EKG None  Radiology DG Foot Complete Right  Result Date: 01/05/2022 CLINICAL DATA:  Foot pain, dropped bookcase on foot today. Swelling at first metatarsal. EXAM: RIGHT FOOT COMPLETE - 3+ VIEW COMPARISON:  None Available. FINDINGS: There is a nondisplaced comminuted fracture of the mid to distal proximal phalanx of the first digit with possible intra-articular extension. No dislocation. There is no evidence of arthropathy or other focal bone abnormality. Soft tissues are unremarkable. IMPRESSION: Nondisplaced comminuted  fracture of the proximal phalanx of the right digit with possible intra-articular extension. Electronically Signed   By: Thornell Sartorius M.D.   On: 01/05/2022 03:14    Procedures Procedures    Medications Ordered in ED Medications  traMADol (ULTRAM) tablet 50 mg (has no administration in time range)    ED Course/ Medical Decision Making/ A&P                           Medical Decision Making Amount and/or Complexity of Data Reviewed Radiology: ordered.  Risk Prescription drug management.   This patient presents to the ED for concern of R great toe pain, this involves an extensive number of treatment options, and is a complaint that carries with it a high risk of complications and morbidity.  The differential diagnosis includes contusion vs subungual hematoma vs fx    Co morbidities that complicate the patient evaluation  None    Additional history obtained:  Additional history obtained from family member at bedside   Imaging Studies ordered:  I ordered imaging studies including Xray R foot  I independently visualized and interpreted imaging which showed fracture noted to the distal aspect of the proximal phalanx of the right great toe I agree with the radiologist interpretation   Cardiac Monitoring:  The patient was maintained on a cardiac monitor.  I personally viewed and interpreted the cardiac monitored which showed an underlying rhythm of: NSR   Medicines ordered and prescription drug management:  I ordered medication including tramadol for pain  Reevaluation of the patient after these medicines showed that the patient stayed the same I have reviewed the patients home medicines and have made adjustments as needed   Problem List / ED Course:  Xray with fx of the R great toe from patient dropping a bookcase on it.  Completed buddy taping of the affected toe is applied postop shoe Crutches given for WBAT   Reevaluation:  After the interventions noted  above, I reevaluated the patient and found that they have :stayed the same   Social Determinants of Health:  Insured patient   Dispostion:  After consideration of the diagnostic results and the patients response to treatment, I feel that the patent would benefit from continued buddy taping with RICE. Crutches given for WBAT. Recommended NSAIDs for pain. Will refer to podiatry for follow up PRN. Return precautions discussed and provided. Patient discharged in stable condition with no unaddressed concerns.          Final Clinical Impression(s) / ED Diagnoses Final diagnoses:  Closed nondisplaced fracture of proximal phalanx  of right great toe, initial encounter    Rx / DC Orders ED Discharge Orders          Ordered    Ambulatory referral to Podiatry        01/05/22 0340    naproxen (NAPROSYN) 500 MG tablet  Every 12 hours PRN        01/05/22 0340              Antony Madura, PA-C 01/05/22 0351    Glendora Score, MD 01/05/22 2018

## 2022-01-07 ENCOUNTER — Telehealth: Payer: Self-pay

## 2022-01-07 ENCOUNTER — Ambulatory Visit (HOSPITAL_COMMUNITY): Payer: Self-pay | Admitting: Psychiatry

## 2022-01-07 NOTE — Telephone Encounter (Signed)
Transition Care Management Unsuccessful Follow-up Telephone Call  Date of discharge and from where:  01/05/2022, [facility]  Attempts:  3rd Attempt  Reason for unsuccessful TCM follow-up call:  No answer/busy

## 2022-01-10 ENCOUNTER — Encounter (HOSPITAL_COMMUNITY): Payer: Self-pay | Admitting: Psychiatry

## 2022-01-10 ENCOUNTER — Ambulatory Visit (HOSPITAL_BASED_OUTPATIENT_CLINIC_OR_DEPARTMENT_OTHER): Payer: 59 | Admitting: Psychiatry

## 2022-01-10 VITALS — Wt 134.0 lb

## 2022-01-10 DIAGNOSIS — F39 Unspecified mood [affective] disorder: Secondary | ICD-10-CM

## 2022-01-10 DIAGNOSIS — F419 Anxiety disorder, unspecified: Secondary | ICD-10-CM | POA: Diagnosis not present

## 2022-01-10 MED ORDER — LAMOTRIGINE 25 MG PO TABS: 25.0000 mg | ORAL_TABLET | Freq: Every day | ORAL | 0 refills | Status: DC
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
## 2022-01-22 ENCOUNTER — Ambulatory Visit (INDEPENDENT_AMBULATORY_CARE_PROVIDER_SITE_OTHER): Payer: 59 | Admitting: Podiatry

## 2022-01-22 DIAGNOSIS — S92404A Nondisplaced unspecified fracture of right great toe, initial encounter for closed fracture: Secondary | ICD-10-CM | POA: Diagnosis not present

## 2022-01-22 NOTE — Progress Notes (Signed)
   Chief Complaint  Patient presents with   Foot Injury    HPI: 31 y.o. female presenting today as a referral from the [facility] long ED for follow-up of the right great toe fracture.  Patient states that on 01/05/2022 she dropped a bookshelf on her toe.  She went to the emergency department and diagnosed with nondisplaced fracture of the toe.  She says the pain only persisted for about 2 days and she no longer has any pain to the area.  She is wearing a surgical shoe today.  Past Medical History:  Diagnosis Date   Anal fissure    History of blood transfusion    History of uterine fibroid    Per [facility] new patient packet    Past Surgical History:  Procedure Laterality Date   CARPAL TUNNEL RELEASE Left 04/15/2012   Procedure: OPEN CARPAL TUNNEL RELEASE WITH REPAIR RECONSTRUCTION AS NECESSARY ;  Surgeon: Dominica Severin, MD;  Location: [facility] OR;  Service: Orthopedics;  Laterality: Left;   OPEN REDUCTION INTERNAL FIXATION (ORIF) DISTAL RADIAL FRACTURE Left 04/15/2012   Procedure: OPEN REDUCTION INTERNAL FIXATION (ORIF) LEFT DISTAL RADIUS FRACTURE;  Surgeon: Dominica Severin, MD;  Location: [facility] OR;  Service: Orthopedics;  Laterality: Left;   TOOTH EXTRACTION      No Known Allergies   Physical Exam: General: The patient is alert and oriented x3 in no acute distress.  Dermatology: Skin is warm, dry and supple bilateral lower extremities.  No open wounds or lacerations  Vascular: Palpable pedal pulses bilaterally. Capillary refill within normal limits.  Minimal edema noted right great toe  Neurological: Light touch and protective threshold grossly intact  Musculoskeletal Exam: No pedal deformities noted.  Gross alignment of the digits  Radiographic Exam RT foot 01/05/2022 ED:  FINDINGS: There is a nondisplaced comminuted fracture of the mid to distal proximal phalanx of the first digit with possible intra-articular extension. No dislocation. There is no evidence of arthropathy or other focal  bone abnormality. Soft tissues are unremarkable.   IMPRESSION: Nondisplaced comminuted fracture of the proximal phalanx of the right digit with possible intra-articular extension.  Assessment: 1.  Closed nondisplaced fracture right great toe proximal phalanx   Plan of Care:  1. Patient evaluated. X-Rays reviewed.  2.  Recommend that she continues weightbearing in the surgical shoe for an additional 6 weeks.  The patient's current surgical shoe is too small and her toes extend beyond the shoe.  Larger surgical shoe was dispensed today 3.  Patient has also been buddy splinting her toe.  Continue. 4.  Return to clinic 6 weeks for follow-up x-ray      Felecia Shelling, DPM [facility]  Dr. Felecia Shelling, DPM    [address] [place], [place] [address]                Office [phone]  Fax [phone]

## 2022-02-11 ENCOUNTER — Telehealth (HOSPITAL_BASED_OUTPATIENT_CLINIC_OR_DEPARTMENT_OTHER): Payer: 59 | Admitting: Psychiatry

## 2022-02-11 ENCOUNTER — Encounter (HOSPITAL_COMMUNITY): Payer: Self-pay | Admitting: Psychiatry

## 2022-02-11 VITALS — Wt 134.0 lb

## 2022-02-11 DIAGNOSIS — F419 Anxiety disorder, unspecified: Secondary | ICD-10-CM | POA: Diagnosis not present

## 2022-02-11 DIAGNOSIS — F39 Unspecified mood [affective] disorder: Secondary | ICD-10-CM

## 2022-02-11 NOTE — Progress Notes (Signed)
Virtual Visit via Video Note  I connected with Wendy Mccarty on 02/11/22 at 11:00 AM EST by a video enabled telemedicine application and verified that I am speaking with the correct person using two identifiers.  Location: Patient: Home Provider: Home Office   I discussed the limitations of evaluation and management by telemedicine and the availability of in person appointments. The patient expressed understanding and agreed to proceed.  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  She was self-referred for the management of her psychiatric symptoms.  She reported a lot of anxiety and feeling overwhelmed and having irritability getting easily lash out.  He is having a lot of issues with the boyfriend.  She has a history of relationship issues in the past.  We started her on Lamictal as she reported impulsivity and spending a lot of money and currently more than $70,000 financial debt.  She was getting easily distracted, labile, unable to focus.  We recommended psychological testing to rule out ADHD.  She has difficulty organizing her thinking.  She also having hypnagogic hallucinations.  She was sleeping too much.  She was using CBD Gummies which she has now cut down significantly.  Patient had appointment [facility] on [facility].  She told that she had more than 3-hour psychological testing results are still pending.  She was told she may have mild ADHD but no medications were given.  She was given Lexapro to help her anxiety.  She stopped the Lamictal after 1 week and started Lexapro.  She is taking 10 mg.  She has not seen a significant improvement but noticed it is too early.  During the session she was calm.  She is wondering if she can get some help for ADHD.  Her long-term plan in the relationship is to keep the separation until she can focus on her mental health.  Past Psychiatric  History: Never seen psychiatrist before.  Saw a few times therapist due to relationship issues.  Never took any psychotropic medication other than Xanax given few years ago and she took Adderall from her friend recently.  No history of suicidal attempt, inpatient treatment.  History of impulsive behavior with excessive spending, speeding tickets, multiple failed relationship.   Psychiatric Specialty Exam: Physical Exam  Review of Systems  Weight 134 lb (60.8 kg).There is no height or weight on file to calculate BMI.  General Appearance: Casual  Eye Contact:  Good  Speech:  Normal Rate  Volume:  Normal  Mood:  Anxious  Affect:  Congruent  Thought Process:  Descriptions of Associations: Intact  Orientation:  Full (Time, Place, and Person)  Thought Content:  Rumination  Suicidal Thoughts:  No  Homicidal Thoughts:  No  Memory:  Immediate;   Good Recent;   Good Remote;   Good  Judgement:  Fair  Insight:  Present  Psychomotor Activity:  Increased  Concentration:  Concentration: Fair and Attention Span: Fair  Recall:  Good  Fund of Knowledge:  Good  Language:  Good  Akathisia:  No  Handed:  Right  AIMS (if indicated):     Assets:  Communication Skills Desire for Improvement Housing Talents/Skills Transportation  ADL's:  Intact  Cognition:  WNL  Sleep:   ok      Assessment and Plan: Mood disorder NOS.  Anxiety.  Rule out ADHD.  I recommend to have her records faxed to [place] so we can review the  findings.  I also encouraged that she should stay with 1 provider since they promised her to address the focus and attention issue.  She is seeing PA Wendy Mccarty.  Patient is still labile and emotional.  She admitted not given enough time to the Lamictal.  She has history of multiple failed relationship and continues to struggle with her currently friendship and finances.  Patient like to have a follow-up appointment in 6 weeks if the Lexapro did not work.  I recommend to discontinue  Lamictal since patient is not taking and agreed to have appointment in 6 weeks.  We will get the records from the PA who did testing.  Recommended to call us back if she has worsening of symptoms.  Follow-up in 6 weeks.  Follow Up Instructions:    I discussed the assessment and treatment plan with the patient. The patient was provided an opportunity to ask questions and all were answered. The patient agreed with the plan and demonstrated an understanding of the instructions.   The patient was advised to call back or seek an in-person evaluation if the symptoms worsen or if the condition fails to improve as anticipated.  Collaboration of Care: Other provider involved in patient's care AEB notes are available in epic to review.  Patient/Guardian was advised Release of Information must be obtained prior to any record release in order to collaborate their care with an outside provider. Patient/Guardian was advised if they have not already done so to contact the registration department to sign all necessary forms in order for [place] to release information regarding their care.   Consent: Patient/Guardian gives verbal consent for treatment and assignment of benefits for services provided during this visit. Patient/Guardian expressed understanding and agreed to proceed.    I provided 26 minutes of non-face-to-face time during this encounter.   Cleotis Nipper, MD

## 2022-03-07 ENCOUNTER — Ambulatory Visit: Payer: 59 | Admitting: Podiatry

## 2022-03-07 ENCOUNTER — Ambulatory Visit (INDEPENDENT_AMBULATORY_CARE_PROVIDER_SITE_OTHER): Payer: 59

## 2022-03-07 VITALS — BP 140/68 | HR 79

## 2022-03-07 DIAGNOSIS — S92404A Nondisplaced unspecified fracture of right great toe, initial encounter for closed fracture: Secondary | ICD-10-CM

## 2022-03-07 NOTE — Progress Notes (Signed)
   Chief Complaint  Patient presents with   Fracture    Fracture right foot, X-Rays done today, Patient denies any pain     HPI: 32 y.o. female presenting today for follow-up of the right great toe fracture.  Patient continues to deny pain.  She is wearing good supportive shoes today.  She has no pain during ambulation.  Patient states that on 01/05/2022 she dropped a bookshelf on her toe.  She went to the emergency department and diagnosed with nondisplaced fracture of the toe.  She says the pain only persisted for about 2 days and she no longer has any pain to the area.   Past Medical History:  Diagnosis Date   Anal fissure    History of blood transfusion    History of uterine fibroid    Per [facility] new patient packet    Past Surgical History:  Procedure Laterality Date   CARPAL TUNNEL RELEASE Left 04/15/2012   Procedure: OPEN CARPAL TUNNEL RELEASE WITH REPAIR RECONSTRUCTION AS NECESSARY ;  Surgeon: Roseanne Kaufman, MD;  Location: [facility];  Service: Orthopedics;  Laterality: Left;   OPEN REDUCTION INTERNAL FIXATION (ORIF) DISTAL RADIAL FRACTURE Left 04/15/2012   Procedure: OPEN REDUCTION INTERNAL FIXATION (ORIF) LEFT DISTAL RADIUS FRACTURE;  Surgeon: Roseanne Kaufman, MD;  Location: [place];  Service: Orthopedics;  Laterality: Left;   TOOTH EXTRACTION      No Known Allergies   Physical Exam: General: The patient is alert and oriented x3 in no acute distress.  Dermatology: Skin is warm, dry and supple bilateral lower extremities.  No open wounds or lacerations  Vascular: Palpable pedal pulses bilaterally. Capillary refill within normal limits.  Minimal edema noted right great toe  Neurological: Light touch and protective threshold grossly intact  Musculoskeletal Exam: No pedal deformities noted.  Gross alignment of the digits  Radiographic Exam RT foot 01/05/2022 ED:  FINDINGS: There is a nondisplaced comminuted fracture of the mid to distal proximal phalanx of the first digit with  possible intra-articular extension. No dislocation. There is no evidence of arthropathy or other focal bone abnormality. Soft tissues are unremarkable.   IMPRESSION: Nondisplaced comminuted fracture of the proximal phalanx of the right digit with possible intra-articular extension.  Radiographic exam RT foot 03/07/2022: Nondisplaced comminuted fracture of the mid to distal proximal phalanx is stable.  Routine healing noted.  No evidence of arthropathy or focal bone abnormality.  Soft tissue unremarkable  Assessment: 1.  Closed nondisplaced fracture right great toe proximal phalanx with routine healing   Plan of Care:  1. Patient evaluated. X-Rays reviewed and compared prior x-rays.  2.  Patient may now resume full activity no restrictions 3.  Recommend good supportive shoes and sneakers 4.  Return to clinic as needed     Edrick Kins, DPM [facility]  Dr. Edrick Kins, DPM    [address], [place] [address]                Office [phone]  Fax [phone]

## 2022-03-18 ENCOUNTER — Other Ambulatory Visit: Payer: Self-pay | Admitting: Podiatry

## 2022-03-18 DIAGNOSIS — S92404A Nondisplaced unspecified fracture of right great toe, initial encounter for closed fracture: Secondary | ICD-10-CM

## 2022-03-26 ENCOUNTER — Telehealth (HOSPITAL_COMMUNITY): Payer: 59 | Admitting: Psychiatry

## 2023-04-27 ENCOUNTER — Other Ambulatory Visit: Payer: Self-pay

## 2023-04-27 DIAGNOSIS — D219 Benign neoplasm of connective and other soft tissue, unspecified: Secondary | ICD-10-CM

## 2023-04-30 ENCOUNTER — Other Ambulatory Visit

## 2023-05-07 ENCOUNTER — Ambulatory Visit
Admission: RE | Admit: 2023-05-07 | Discharge: 2023-05-07 | Disposition: A | Discharge status: Home / Self Care | Source: Ambulatory Visit

## 2023-05-07 DIAGNOSIS — D219 Benign neoplasm of connective and other soft tissue, unspecified: Secondary | ICD-10-CM

## 2023-09-30 IMAGING — CT CT HEART MORP W/ CTA COR W/ SCORE W/ CA W/CM &/OR W/O CM
1 of 13 series · 2 of 20 positions shown, 3 images · IV contrast (APPLIED)
Comparison: None.
COMPARISON: None.

Addendum:
EXAM:
OVER-READ INTERPRETATION  CT CHEST

The following report is an over-read performed by radiologist Dr.
Xendan R Selvane [[email or web address]] on 05/09/2021. This over-read
does not include interpretation of cardiac or coronary anatomy or
pathology. The coronary CTA interpretation by the cardiologist is
attached.
CLINICAL DATA: This is a 30 year old female with anginal symptoms.
Cardiac/Coronary  CTA
TECHNIQUE: The patient was scanned on a Phillips Force scanner.

[Series 11: ts syst · axial · 0.27mm/px · z∈[-69,+47]mm · 2 of 291 slices shown, 3 images]
[im 1/291  vessel]
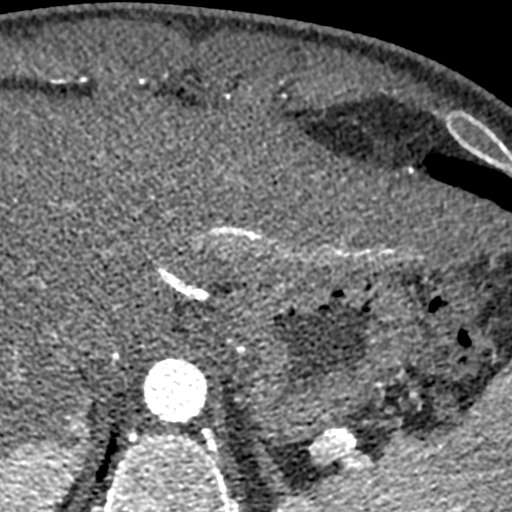
[im 1/291  lung]
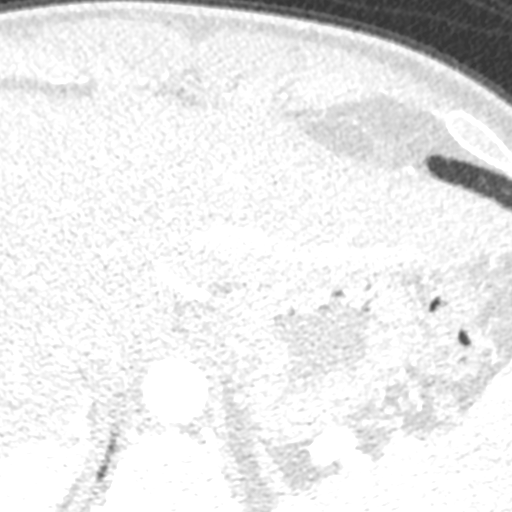
[im 291/291  vessel]
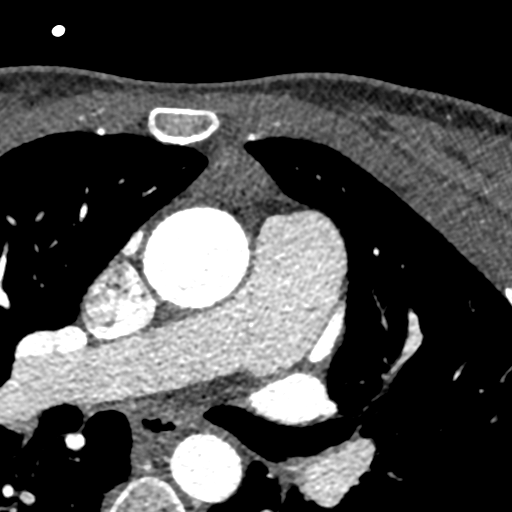

[2 of 20 positions shown; findings below may reference images not displayed]

FINDINGS: Vascular: Heart is normal size.  Aorta normal caliber.

Mediastinum/Nodes: No adenopathy

Lungs/Pleura: No confluent opacities or effusions.

Upper Abdomen: Imaging into the upper abdomen demonstrates no acute
findings.

Musculoskeletal: Chest wall soft tissues are unremarkable. No acute
bony abnormality.
IMPRESSION: No acute or significant extracardiac abnormality.
FINDINGS: A 100 kV prospective scan was triggered in the descending thoracic
aorta at 111 HU's. Axial non-contrast 3 mm slices were carried out
through the heart. The data set was analyzed on a dedicated work
station and scored using the Agatson method. Gantry rotation speed
was 250 msecs and collimation was .6 mm. No beta blockade and 0.8 mg
of sl NTG was given. The 3D data set was reconstructed in 5%
intervals of the 67-82 % of the R-R cycle. Diastolic phases were
analyzed on a dedicated work station using MPR, MIP and VRT modes.
The patient received 80 cc of contrast.

Aorta: Normal size.  No calcifications.  No dissection.

Aortic Valve:  Trileaflet.  No calcifications.

Coronary Arteries:  Normal coronary origin.  Right dominance.

RCA is a large dominant artery that gives rise to PDA and PLA. There
is no plaque.

Left main is a large artery that gives rise to LAD and LCX arteries.

LAD is a large vessel that has no plaque.

LCX is a non-dominant artery that gives rise to one large OM1
branch. There is no plaque.

Coronary Calcium Score:

Left main: 0

Left anterior descending artery: 0

Left circumflex artery: 0

Right coronary artery: 0

Total: 0

Percentile: 0

Other findings:

Normal pulmonary vein drainage into the left atrium.

Normal left atrial appendage without a thrombus.

Normal size of the pulmonary artery.
IMPRESSION: 1. Coronary calcium score of 0. This was 0 percentile for age and
sex matched control.

2. Normal coronary origin with right dominance.

3. No evidence of CAD.

CAD-RADS 0. No evidence of CAD (0%). Consider non-atherosclerotic
causes of chest pain.

*** End of Addendum ***
EXAM:
OVER-READ INTERPRETATION  CT CHEST

The following report is an over-read performed by radiologist Dr.
Xendan R Selvane [[email or web address]] on 05/09/2021. This over-read
does not include interpretation of cardiac or coronary anatomy or
pathology. The coronary CTA interpretation by the cardiologist is
attached.
FINDINGS: Vascular: Heart is normal size.  Aorta normal caliber.

Mediastinum/Nodes: No adenopathy

Lungs/Pleura: No confluent opacities or effusions.

Upper Abdomen: Imaging into the upper abdomen demonstrates no acute
findings.

Musculoskeletal: Chest wall soft tissues are unremarkable. No acute
bony abnormality.
IMPRESSION: No acute or significant extracardiac abnormality.

## 2024-03-17 ENCOUNTER — Encounter: Payer: Self-pay | Admitting: Orthopedic Surgery

## 2024-03-17 ENCOUNTER — Encounter: Admitting: Orthopedic Surgery

## 2024-03-17 ENCOUNTER — Ambulatory Visit (INDEPENDENT_AMBULATORY_CARE_PROVIDER_SITE_OTHER): Admitting: Orthopedic Surgery

## 2024-03-17 VITALS — BP 118/78 | HR 87 | Temp 97.4°F | Ht 65.0 in | Wt 151.2 lb

## 2024-03-17 DIAGNOSIS — Z0000 Encounter for general adult medical examination without abnormal findings: Secondary | ICD-10-CM

## 2024-03-17 DIAGNOSIS — Z1159 Encounter for screening for other viral diseases: Secondary | ICD-10-CM

## 2024-03-17 NOTE — Progress Notes (Signed)
 "   Careteam: Patient Care Team: Gil Greig BRAVO, NP as PCP - General (Adult Health Nurse Practitioner) Latisha Medford, MD as Consulting Physician (Obstetrics and Gynecology)  Seen by: Greig Gil, AGNP-C  PLACE OF SERVICE:  [facility]  Advanced Directive information    Allergies[1]  Chief Complaint  Patient presents with   Annual Exam    Discuss the need for pap smear.     HPI: Patient is a 34 y.o. female seen today for annual physical exam.   Blood pressure at goal.   No recent hospitalizations.   BMI 25.16.   Admits to some GI issues. Eats restaurant foods daily.   PAP was done 03/07/2021 at [facility]. [facility]   Stopped antidepressant awhile back. No changes in mood.       Review of Systems:  Review of Systems  Constitutional: Negative.   HENT: Negative.    Eyes: Negative.   Respiratory: Negative.    Cardiovascular: Negative.   Gastrointestinal: Negative.   Genitourinary: Negative.   Musculoskeletal: Negative.   Skin: Negative.   Neurological:  Positive for dizziness and headaches.  Endo/Heme/Allergies: Negative.   Psychiatric/Behavioral: Negative.      Past Medical History:  Diagnosis Date   Anal fissure    History of blood transfusion    History of uterine fibroid    Per [facility] new patient packet   Past Surgical History:  Procedure Laterality Date   CARPAL TUNNEL RELEASE Left 04/15/2012   Procedure: OPEN CARPAL TUNNEL RELEASE WITH REPAIR RECONSTRUCTION AS NECESSARY ;  Surgeon: Elsie Mussel, MD;  Location: [facility];  Service: Orthopedics;  Laterality: Left;   OPEN REDUCTION INTERNAL FIXATION (ORIF) DISTAL RADIAL FRACTURE Left 04/15/2012   Procedure: OPEN REDUCTION INTERNAL FIXATION (ORIF) LEFT DISTAL RADIUS FRACTURE;  Surgeon: Elsie Mussel, MD;  Location: [facility];  Service: Orthopedics;  Laterality: Left;   TOOTH EXTRACTION     Social History:   reports that she has never smoked. She has never used  smokeless tobacco. She reports current alcohol use. She reports that she does not use drugs.  Family History  Problem Relation Age of Onset   Hypertension Mother    Alcohol abuse Mother    Alcohol abuse Father    Hypertension Maternal Grandfather    Colon cancer Neg Hx    Esophageal cancer Neg Hx    Pancreatic cancer Neg Hx    Liver disease Neg Hx    Kidney disease Neg Hx    Diabetes Neg Hx    Heart disease Neg Hx     Medications: Patient's Medications  New Prescriptions   No medications on file  Previous Medications   ADZENYS XR-ODT 9.4 MG TBED    Take 9.4 mg by mouth daily.   BISACODYL (DULCOLAX) 5 MG EC TABLET    Take 5 mg by mouth daily as needed.   CETIRIZINE  (ZYRTEC  ALLERGY) 10 MG TABLET    Take 1 tablet (10 mg total) by mouth daily.   ESCITALOPRAM (LEXAPRO) 10 MG TABLET    Take 10 mg by mouth daily.   HYDROXYZINE (ATARAX) 25 MG TABLET    Take 25-50 mg by mouth daily as needed.   LAMOTRIGINE  (LAMICTAL ) 25 MG TABLET    Take 1 tablet (25 mg total) by mouth daily.   METOPROLOL  TARTRATE (LOPRESSOR ) 100 MG TABLET    Take 1 tablet by mouth 2 hours prior to Cardiac CT   NAPROXEN  (NAPROSYN ) 500 MG TABLET  Take 1 tablet (500 mg total) by mouth every 12 (twelve) hours as needed for mild pain or moderate pain.   NORETHIN -ETH ESTRAD-FE BIPHAS (LO LOESTRIN FE PO)    Take 1 capsule by mouth daily.   TIZANIDINE  (ZANAFLEX ) 4 MG TABLET    Take 1 tablet (4 mg total) by mouth every 6 (six) hours as needed for muscle spasms.   VALACYCLOVIR  (VALTREX ) 500 MG TABLET    Take 500 mg by mouth as needed.  Modified Medications   No medications on file  Discontinued Medications   No medications on file    Physical Exam:  There were no vitals filed for this visit. There is no height or weight on file to calculate BMI. Wt Readings from Last 3 Encounters:  01/05/22 134 lb (60.8 kg)  11/22/21 141 lb (64 kg)  10/17/21 138 lb 9.6 oz (62.9 kg)    Physical Exam Vitals reviewed.  Constitutional:       General: She is not in acute distress. HENT:     Head: Normocephalic.     Right Ear: Tympanic membrane normal. There is no impacted cerumen.     Left Ear: Tympanic membrane normal. There is no impacted cerumen.     Nose: Nose normal.     Mouth/Throat:     Mouth: Mucous membranes are moist.     Pharynx: No posterior oropharyngeal erythema.  Eyes:     General:        Right eye: No discharge.        Left eye: No discharge.  Neck:     Thyroid : No thyroid  mass or thyromegaly.  Cardiovascular:     Rate and Rhythm: Normal rate and regular rhythm.     Pulses: Normal pulses.     Heart sounds: Normal heart sounds.  Pulmonary:     Effort: Pulmonary effort is normal.     Breath sounds: Normal breath sounds.  Abdominal:     General: Bowel sounds are normal.     Palpations: Abdomen is soft.  Musculoskeletal:     Right lower leg: No edema.     Left lower leg: No edema.  Skin:    General: Skin is warm.     Capillary Refill: Capillary refill takes less than 2 seconds.  Neurological:     General: No focal deficit present.     Mental Status: She is alert and oriented to person, place, and time.  Psychiatric:        Mood and Affect: Mood normal.     Labs reviewed: Basic Metabolic Panel: No results for input(s): NA, K, CL, CO2, GLUCOSE, BUN, CREATININE, CALCIUM, MG, PHOS, TSH in the last 8760 hours. Liver Function Tests: No results for input(s): AST, ALT, ALKPHOS, BILITOT, PROT, ALBUMIN in the last 8760 hours. No results for input(s): LIPASE, AMYLASE in the last 8760 hours. No results for input(s): AMMONIA in the last 8760 hours. CBC: No results for input(s): WBC, NEUTROABS, HGB, HCT, MCV, PLT in the last 8760 hours. Lipid Panel: No results for input(s): CHOL, HDL, LDLCALC, TRIG, CHOLHDL, LDLDIRECT in the last 8760 hours. TSH: No results for input(s): TSH in the last 8760 hours. A1C: No results found for:  HGBA1C   Assessment/Plan 1. Annual physical exam (Primary) - bp at goal, < 140/80 - BMI 25.19 - exam unremarkable - CBC with Differential/Platelet - Basic Metabolic Panel with eGFR  2. Need for hepatitis C screening test - Hep C Antibody  Total time: 32 minutes. Greater than 50%  of total time spent doing patient education regarding health maintenance, healthy diet and symptom/medication management.     Next appt: Visit date not found  Mina Babula Gil BODILY  [facility] [phone]      [1] No Known Allergies  "

## 2024-03-17 NOTE — Patient Instructions (Addendum)
 Consider local health department for birth control management and womens health visits  [address]  [phone]  Drink more water> at least 50-60 ounces daily

## 2024-03-18 ENCOUNTER — Ambulatory Visit: Payer: Self-pay | Admitting: Orthopedic Surgery

## 2024-03-18 LAB — CBC WITH DIFFERENTIAL/PLATELET
Absolute Lymphocytes: 2400 {cells}/uL (ref 850–3900)
Absolute Monocytes: 450 {cells}/uL (ref 200–950)
Basophils Absolute: 30 {cells}/uL (ref 0–200)
Basophils Relative: 0.5 %
Eosinophils Absolute: 138 {cells}/uL (ref 15–500)
Eosinophils Relative: 2.3 %
HCT: 38.5 % (ref 35.9–46.0)
Hemoglobin: 12.7 g/dL (ref 11.7–15.5)
MCH: 31.3 pg (ref 27.0–33.0)
MCHC: 33 g/dL (ref 31.6–35.4)
MCV: 94.8 fL (ref 81.4–101.7)
MPV: 10.3 fL (ref 7.5–12.5)
Monocytes Relative: 7.5 %
Neutro Abs: 2982 {cells}/uL (ref 1500–7800)
Neutrophils Relative %: 49.7 %
Platelets: 315 Thousand/uL (ref 140–400)
RBC: 4.06 Million/uL (ref 3.80–5.10)
RDW: 10.9 % — ABNORMAL LOW (ref 11.0–15.0)
Total Lymphocyte: 40 %
WBC: 6 Thousand/uL (ref 3.8–10.8)

## 2024-03-18 LAB — BASIC METABOLIC PANEL WITHOUT GFR
BUN: 12 mg/dL (ref 7–25)
CO2: 29 mmol/L (ref 20–32)
Calcium: 9.6 mg/dL (ref 8.6–10.2)
Chloride: 103 mmol/L (ref 98–110)
Creat: 0.66 mg/dL (ref 0.50–0.97)
Glucose, Bld: 80 mg/dL (ref 65–99)
Potassium: 4.3 mmol/L (ref 3.5–5.3)
Sodium: 138 mmol/L (ref 135–146)

## 2024-03-18 LAB — HEPATITIS C ANTIBODY: Hepatitis C Ab: NONREACTIVE

## 2025-03-23 ENCOUNTER — Encounter: Admitting: Orthopedic Surgery
